# Patient Record
Sex: Male | Born: 1940 | Race: Black or African American | Hispanic: No | Marital: Married | Smoking: Former smoker
Health system: Southern US, Community
[De-identification: ages and names within clinical notes are randomized; demographics above are authoritative.]

## PROBLEM LIST (undated history)

## (undated) DIAGNOSIS — E7800 Pure hypercholesterolemia, unspecified: Secondary | ICD-10-CM

## (undated) DIAGNOSIS — I499 Cardiac arrhythmia, unspecified: Secondary | ICD-10-CM

## (undated) DIAGNOSIS — Z972 Presence of dental prosthetic device (complete) (partial): Secondary | ICD-10-CM

## (undated) DIAGNOSIS — R011 Cardiac murmur, unspecified: Secondary | ICD-10-CM

## (undated) DIAGNOSIS — I639 Cerebral infarction, unspecified: Secondary | ICD-10-CM

## (undated) DIAGNOSIS — E119 Type 2 diabetes mellitus without complications: Secondary | ICD-10-CM

## (undated) DIAGNOSIS — I10 Essential (primary) hypertension: Secondary | ICD-10-CM

## (undated) DIAGNOSIS — N289 Disorder of kidney and ureter, unspecified: Secondary | ICD-10-CM

## (undated) HISTORY — PX: COLONOSCOPY: SHX174

## (undated) HISTORY — DX: Type 2 diabetes mellitus without complications: E11.9

## (undated) HISTORY — DX: Essential (primary) hypertension: I10

---

## 2008-03-19 ENCOUNTER — Ambulatory Visit: Payer: Self-pay | Admitting: Ophthalmology

## 2008-03-22 HISTORY — PX: CATARACT EXTRACTION: SUR2

## 2008-04-03 ENCOUNTER — Ambulatory Visit: Payer: Self-pay | Admitting: Ophthalmology

## 2011-04-21 ENCOUNTER — Ambulatory Visit: Payer: Self-pay

## 2011-11-18 ENCOUNTER — Ambulatory Visit: Payer: Self-pay | Admitting: Internal Medicine

## 2011-11-21 ENCOUNTER — Ambulatory Visit: Payer: Self-pay | Admitting: Internal Medicine

## 2011-11-26 LAB — PROT IMMUNOELECT,UR-24HR

## 2011-12-21 ENCOUNTER — Ambulatory Visit: Payer: Self-pay | Admitting: Internal Medicine

## 2012-01-21 ENCOUNTER — Ambulatory Visit: Payer: Self-pay | Admitting: Internal Medicine

## 2013-05-18 LAB — CBC WITH DIFFERENTIAL/PLATELET
BASOS ABS: 0 10*3/uL (ref 0.0–0.1)
Basophil %: 0.4 %
EOS PCT: 0 %
Eosinophil #: 0 10*3/uL (ref 0.0–0.7)
HCT: 42 % (ref 40.0–52.0)
HGB: 13.4 g/dL (ref 13.0–18.0)
Lymphocyte #: 1.1 10*3/uL (ref 1.0–3.6)
Lymphocyte %: 10.4 %
MCH: 25.6 pg — ABNORMAL LOW (ref 26.0–34.0)
MCHC: 31.9 g/dL — ABNORMAL LOW (ref 32.0–36.0)
MCV: 80 fL (ref 80–100)
MONO ABS: 0.9 x10 3/mm (ref 0.2–1.0)
MONOS PCT: 8.7 %
Neutrophil #: 8.6 10*3/uL — ABNORMAL HIGH (ref 1.4–6.5)
Neutrophil %: 80.5 %
Platelet: 156 10*3/uL (ref 150–440)
RBC: 5.22 10*6/uL (ref 4.40–5.90)
RDW: 15.8 % — ABNORMAL HIGH (ref 11.5–14.5)
WBC: 10.7 10*3/uL — ABNORMAL HIGH (ref 3.8–10.6)

## 2013-05-18 LAB — BASIC METABOLIC PANEL
Anion Gap: 10 (ref 7–16)
BUN: 18 mg/dL (ref 7–18)
CALCIUM: 8.8 mg/dL (ref 8.5–10.1)
Chloride: 103 mmol/L (ref 98–107)
Co2: 24 mmol/L (ref 21–32)
Creatinine: 1.47 mg/dL — ABNORMAL HIGH (ref 0.60–1.30)
EGFR (African American): 54 — ABNORMAL LOW
GFR CALC NON AF AMER: 47 — AB
Glucose: 69 mg/dL (ref 65–99)
OSMOLALITY: 274 (ref 275–301)
POTASSIUM: 3.5 mmol/L (ref 3.5–5.1)
SODIUM: 137 mmol/L (ref 136–145)

## 2013-05-18 LAB — TROPONIN I: TROPONIN-I: 0.15 ng/mL — AB

## 2013-05-18 LAB — RAPID INFLUENZA A&B ANTIGENS

## 2013-05-19 ENCOUNTER — Inpatient Hospital Stay: Payer: Self-pay | Admitting: Internal Medicine

## 2013-05-19 LAB — TROPONIN I
TROPONIN-I: 0.23 ng/mL — AB
Troponin-I: 0.2 ng/mL — ABNORMAL HIGH

## 2013-05-19 LAB — URINALYSIS, COMPLETE
Bilirubin,UR: NEGATIVE
Glucose,UR: NEGATIVE mg/dL (ref 0–75)
Granular Cast: 6
Hyaline Cast: 4
Ketone: NEGATIVE
LEUKOCYTE ESTERASE: NEGATIVE
NITRITE: NEGATIVE
Ph: 5 (ref 4.5–8.0)
RBC,UR: 1 /HPF (ref 0–5)
SPECIFIC GRAVITY: 1.013 (ref 1.003–1.030)
Squamous Epithelial: 1

## 2013-05-19 LAB — PRO B NATRIURETIC PEPTIDE: B-TYPE NATIURETIC PEPTID: 141 pg/mL — AB (ref 0–125)

## 2013-05-19 LAB — CK-MB: CK-MB: 5.7 ng/mL — AB (ref 0.5–3.6)

## 2013-05-19 LAB — CK: CK, TOTAL: 3055 U/L — AB

## 2013-05-20 LAB — BASIC METABOLIC PANEL
Anion Gap: 7 (ref 7–16)
BUN: 20 mg/dL — ABNORMAL HIGH (ref 7–18)
CALCIUM: 8.7 mg/dL (ref 8.5–10.1)
CO2: 25 mmol/L (ref 21–32)
Chloride: 109 mmol/L — ABNORMAL HIGH (ref 98–107)
Creatinine: 1.13 mg/dL (ref 0.60–1.30)
EGFR (African American): >60
EGFR (Non-African Amer.): >60
Glucose: 99 mg/dL (ref 65–99)
Osmolality: 284 (ref 275–301)
POTASSIUM: 3.5 mmol/L (ref 3.5–5.1)
Sodium: 141 mmol/L (ref 136–145)

## 2013-05-20 LAB — MAGNESIUM: MAGNESIUM: 1.7 mg/dL — AB

## 2013-05-20 LAB — CK: CK, TOTAL: 4139 U/L — AB

## 2013-05-21 LAB — CK: CK, Total: 2625 U/L — ABNORMAL HIGH

## 2013-12-20 DIAGNOSIS — E1129 Type 2 diabetes mellitus with other diabetic kidney complication: Secondary | ICD-10-CM | POA: Insufficient documentation

## 2013-12-20 DIAGNOSIS — Z794 Long term (current) use of insulin: Secondary | ICD-10-CM | POA: Insufficient documentation

## 2013-12-20 DIAGNOSIS — R809 Proteinuria, unspecified: Secondary | ICD-10-CM | POA: Insufficient documentation

## 2014-04-03 DIAGNOSIS — E785 Hyperlipidemia, unspecified: Secondary | ICD-10-CM | POA: Diagnosis not present

## 2014-04-03 DIAGNOSIS — E119 Type 2 diabetes mellitus without complications: Secondary | ICD-10-CM | POA: Diagnosis not present

## 2014-04-03 DIAGNOSIS — E1169 Type 2 diabetes mellitus with other specified complication: Secondary | ICD-10-CM | POA: Diagnosis not present

## 2014-04-03 DIAGNOSIS — Z794 Long term (current) use of insulin: Secondary | ICD-10-CM | POA: Diagnosis not present

## 2014-04-03 DIAGNOSIS — I10 Essential (primary) hypertension: Secondary | ICD-10-CM | POA: Diagnosis not present

## 2014-04-09 DIAGNOSIS — E1165 Type 2 diabetes mellitus with hyperglycemia: Secondary | ICD-10-CM | POA: Diagnosis not present

## 2014-04-18 DIAGNOSIS — R809 Proteinuria, unspecified: Secondary | ICD-10-CM | POA: Diagnosis not present

## 2014-04-18 DIAGNOSIS — Z794 Long term (current) use of insulin: Secondary | ICD-10-CM | POA: Diagnosis not present

## 2014-04-18 DIAGNOSIS — E1121 Type 2 diabetes mellitus with diabetic nephropathy: Secondary | ICD-10-CM | POA: Diagnosis not present

## 2014-04-18 DIAGNOSIS — I10 Essential (primary) hypertension: Secondary | ICD-10-CM | POA: Diagnosis not present

## 2014-07-13 NOTE — H&P (Signed)
PATIENT NAME:  Darryl Carter, Darryl Carter MR#:  [id number] DATE OF BIRTH:  19-Jan-1941  DATE OF ADMISSION:  05/19/2013  REFERRING PHYSICIAN:  Dr.  Cheri Guppy.   PRIMARY CARE PHYSICIAN: Dr. Keith Rake.   CHIEF COMPLAINT: Weakness.  HISTORY OF PRESENT ILLNESS:  This is a 74 year old African-American gentleman with past medical history of type 2 diabetes, insulin requiring; hypertension, hyperlipidemia, chronic kidney disease, presenting with weakness, describes two day 2 to 3 days' duration of weakness with associated cough which is nonproductive. His weakness is generalized, most notable when going from sitting to standing position.  He, however, denies any lightheadedness, denies any nausea or vomiting, diarrhea, constipation, chest pains, shortness of breath or myalgias. However, today, secondary to generalized weakness while in the restroom arising from the toilet, he had a possible brief syncopal episode with loss of consciousness less than a few seconds. No post-ictal state. No loss of bowel or bladder function. Upon arrival  to the Emergency Department, he was noted to be hypoxemic, requiring supplemental O2 to maintain SaO2 greater than 92%. Currently without complaints.    REVIEW OF SYSTEMS:  CONSTITUTIONAL: Positive for fatigue, weakness. Denies fevers, chills.  EYES: Denied blurred vision, double vision, eye pain.  EARS, NOSE, THROAT: Denies tinnitus, ear pain, hearing loss.  RESPIRATORY: Positive for cough and wheeze. Denies hemoptysis.  CARDIOVASCULAR: Denies chest pain, palpitations, edema.  GASTROINTESTINAL: Denies nausea, vomiting, diarrhea, abdominal pain.  GENITOURINARY: Denies dysuria, hematuria.  ENDOCRINE: Denies nocturia or thyroid problems.  HEMATOLOGIC/LYMPHATIC: Denies easy bruising or bleeding.  SKIN: Denies rashes or lesions.  MUSCULOSKELETAL: Denies pain in neck, back, shoulder, knees, hips or other arthritic symptoms.  NEUROLOGIC: Denies paralysis, paresthesias.   PSYCHIATRIC: Denies  anxiety or depression.   Otherwise, full review of systems performed by me is negative.   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   SOCIAL HISTORY: Positive for 80 pack-year smoking history; currently, does not smoke.  Denies any alcohol or drug usage.   FAMILY HISTORY: Positive for diabetes, hypertension in multiple family members.   ALLERGIES: No known drug allergies.   HOME MEDICATIONS: Include enalapril 20 mg p.o. b.i.d., metformin 500 mg p.o. b.i.d. Novolin sliding scale as well as Novolin R 16 unit's b.i.d., Lipitor 80 mg p.o. at bedtime, Plavix 75 mg p.o. daily, metoprolol 50 mg p.o. b.i.d., Norvasc 10 mg p.o. daily, hydrochlorothiazide 25 mg p.o. daily, vitamin C 500 mg p.o. daily.   PHYSICAL EXAMINATION: VITAL SIGNS: Temperature 99.5, heart rate 98, respirations 24, blood pressure 156/78, saturating 94% on supplemental O2. Weight 89.4 kg, BMI 30.9.  GENERAL: Well-nourished, well-developed African-American gentleman, currently in no acute distress.  HEAD: Normocephalic, atraumatic.  EYES: Pupils equal, round, and react to light. Extraocular muscles are intact.  No scleral icterus. MOUTH: Moist mucosal membrane. Dentition intact. No abscess noted.  EAR, NOSE, THROAT: Clear, without exudates. No external lesions.  NECK: Supple. No thyromegaly. No nodules. No JVD.  PULMONARY: Diffuse wheezing with prolonged expiratory phase in all lung fields. Offered no use of accessory muscles. Good respiratory effort.  CHEST: Nontender to palpation.  CARDIOVASCULAR: S1, S2, tachycardic. No murmurs, rubs, or gallops. No edema. Pedal pulses 2+ bilaterally.  CHEST: Nontender to palpation.  ABDOMEN: Denies any masses, positive bowel sounds. No hepatosplenomegaly.  MUSCULOSKELETAL: No swelling, clubbing, edema. Range of motion full in all extremities.   NEUROLOGIC: Cranial nerves II through XII intact.  No gross focal neurological deficits. Sensation intact, reflexes intact.  SKIN:  No ulcerations, lesions, rashes, cyanosis. Skin warm, dry. Turgor intact.  PSYCHIATRIC: Mood and affect within normal limits. The patient is alert and oriented x 3. Insight and judgment intact.   LABORATORY DATA: EKG performed;  sinus tachycardia, heart rate 104 with minimal voltage criteria for LVH. No ST or T-wave abnormalities. Chest x-ray performed revealing no acute cardiopulmonary process.   REMAINDER OF LABORATORY DATA: Sodium 137, potassium 3.5, chloride 103, bicarb 24, BUN 18, creatinine 1.47, glucose 169. Troponin 0.15. WBC 10.7, hemoglobin 13.4, platelets 156. Influenza swab positive for influenza A.   ASSESSMENT AND PLAN: A 74 year old African-American gentleman presenting with weakness, as well as brief syncopal episode.  1. Systemic inflammatory response syndrome: Meeting systemic inflammatory response syndrome criteria by heart rate and respiratory rate. He is influenza-positive, started on Tamiflu by the Emergency Department. I will provide intravenous fluid hydration. No indication for antibiotics at this time.  2.  Chronic obstructive pulmonary disease exacerbation in setting of influenza:  DuoNeb therapy q.4 hours, incentive spirometry, Solu-Medrol 60 mg intravenous daily, supplemental O2 to keep oxygen saturation greater than 92%.  3.  Troponin I elevation, likely related to renal function: We will trend cardiac enzymes, continue on telemetry. He has received aspirin.  4.  Possible syncope. Continue to monitor on telemetry. Correct electrolytes as required.  No correction is required at this time.  5. Hypertension: Continue home medications of Norvasc, enalapril, hydrochlorothiazide, metoprolol.  6.  Diabetes. Add insulin sliding scale  with q.6 hour Accu-Cheks. Hold p.o. agents.  7.  Deep venous thrombosis prophylaxis: With heparin subcutaneous.  8.  CODE STATUS: The patient is full code.   TIME  SPENT: 45 minutes.     ____________________________ Aaron Mose. Hower, MD dkh:NTS D: 05/19/2013 02:03:00 ET T: 05/19/2013 02:28:24 ET JOB#: [id number]  cc: Aaron Mose. Hower, MD, <Dictator>  DAVID Woodfin Ganja MD ELECTRONICALLY SIGNED 05/19/2013 20:11

## 2014-07-13 NOTE — Discharge Summary (Signed)
PATIENT NAME:  Darryl Carter, Darryl Carter MR#:  [id number] DATE OF BIRTH:  05-Oct-1940  DATE OF ADMISSION:  05/19/2013 DATE OF DISCHARGE:  05/21/2013  PRIMARY CARE PHYSICIAN:  Dr. Keith Rake.  DISCHARGE DIAGNOSES: 1.  Systemic inflammatory response syndrome,  2.  Influenza.  3. Chronic obstructive pulmonary disease exacerbation. 4.  Rhabdomyolysis.  5.  Chronic kidney disease. 6.  Hypertension.  7.  Diabetes.   CONDITION: Stable.   CODE STATUS: FULL CODE.   MEDICATIONS: Please refer to the medication reconciliation list.   DIET: Low sodium, ADA diet.   ACTIVITY: As tolerated.   FOLLOW-UP CARE: Follow up with PCP within 1 to 2 weeks   REASON FOR ADMISSION: Weakness.   HOSPITAL COURSE: The patient is a 74 year old African American gentleman with a history of hypertension, diabetes, CKD. He presented to the ED with weakness for 2 to 3 days associated with cough, which was nonproductive. The patient had a brief syncope episode with loss of consciousness, less than a few seconds. For detailed history and physical examination, please refer to the admission note dictated by Dr. Lavetta Nielsen.  Laboratory data on admission date showed BUN 18, creatinine 1.47. Troponin 0.15. WBC 10.7, hemoglobin 13.4. Influenza A is positive.  1.  Systemic inflammatory response syndrome, likely due to influenza A. After admission, the patient has been treated with Tamiflu and IV fluid rehydration. The patient's symptoms have much improved.  2.  Chronic obstructive pulmonary disease exacerbation in the setting of influenza.  The patient has been treated with DuoNeb, Solu-Medrol. The patient's symptoms have much improved, off oxygen.  3.  Elevated troponin, which is likely due to renal function. The patient's troponin level is stable. The patient has no chest pain.  4.  Hypertension. Has been treated with Norvasc, enalapril, and Lopressor. Blood pressure has been controlled.  5.  Chronic kidney disease.  After IV fluid support, the  patient's creatinine decreased to normal range.  6.  Rhabdomyolysis. The patient's CK was high, about 4000 after IV fluid rehydration. CK decreased to 2600.  7.  The patient's symptoms have much improved. He has no complaints. Vital signs are stable. He is going to be discharged home today. I discussed the patient's discharge plan with the patient, nurse, and case manager.   TIME SPENT: About 38 minutes.   ____________________________ Demetrios Loll, MD qc:dp D: 05/21/2013 17:14:34 ET T: 05/22/2013 11:00:38 ET JOB#: [id number]  cc: Demetrios Loll, MD, <Dictator> Demetrios Loll MD ELECTRONICALLY SIGNED 05/22/2013 18:12

## 2014-07-17 DIAGNOSIS — Z9181 History of falling: Secondary | ICD-10-CM | POA: Diagnosis not present

## 2014-07-17 DIAGNOSIS — Z1389 Encounter for screening for other disorder: Secondary | ICD-10-CM | POA: Diagnosis not present

## 2014-07-17 DIAGNOSIS — Z0000 Encounter for general adult medical examination without abnormal findings: Secondary | ICD-10-CM | POA: Diagnosis not present

## 2014-07-18 DIAGNOSIS — E1121 Type 2 diabetes mellitus with diabetic nephropathy: Secondary | ICD-10-CM | POA: Diagnosis not present

## 2014-07-25 DIAGNOSIS — I10 Essential (primary) hypertension: Secondary | ICD-10-CM | POA: Diagnosis not present

## 2014-07-25 DIAGNOSIS — R809 Proteinuria, unspecified: Secondary | ICD-10-CM | POA: Diagnosis not present

## 2014-07-25 DIAGNOSIS — Z0000 Encounter for general adult medical examination without abnormal findings: Secondary | ICD-10-CM | POA: Diagnosis not present

## 2014-07-25 DIAGNOSIS — E1121 Type 2 diabetes mellitus with diabetic nephropathy: Secondary | ICD-10-CM | POA: Diagnosis not present

## 2014-07-25 DIAGNOSIS — Z794 Long term (current) use of insulin: Secondary | ICD-10-CM | POA: Diagnosis not present

## 2014-07-25 DIAGNOSIS — E1169 Type 2 diabetes mellitus with other specified complication: Secondary | ICD-10-CM | POA: Diagnosis not present

## 2014-08-30 ENCOUNTER — Other Ambulatory Visit: Payer: Self-pay

## 2014-08-30 DIAGNOSIS — E785 Hyperlipidemia, unspecified: Secondary | ICD-10-CM

## 2014-08-30 MED ORDER — ATORVASTATIN CALCIUM 80 MG PO TABS: 80.0000 mg | ORAL_TABLET | Freq: Every day | ORAL | Status: DC

## 2014-08-30 NOTE — Telephone Encounter (Signed)
Requested from [facility] by fax.

## 2014-10-01 ENCOUNTER — Telehealth: Payer: Self-pay | Admitting: Family Medicine

## 2014-10-01 NOTE — Telephone Encounter (Signed)
Pt has appointment for October 10, 2014 but he is completely out of clopidogrel 75mg . Please send to [email or web address]

## 2014-10-03 MED ORDER — CLOPIDOGREL BISULFATE 75 MG PO TABS: 75.0000 mg | ORAL_TABLET | Freq: Every day | ORAL | Status: DC

## 2014-10-03 NOTE — Telephone Encounter (Signed)
Medication has been refilled and sent to [facility], patient has appointment on 10/10/2014

## 2014-10-10 ENCOUNTER — Ambulatory Visit (INDEPENDENT_AMBULATORY_CARE_PROVIDER_SITE_OTHER): Payer: Medicare Other | Admitting: Family Medicine

## 2014-10-10 ENCOUNTER — Encounter: Payer: Self-pay | Admitting: Family Medicine

## 2014-10-10 VITALS — BP 116/70 | HR 75 | Temp 97.8°F | Resp 19 | Ht 65.0 in | Wt 190.6 lb

## 2014-10-10 DIAGNOSIS — E119 Type 2 diabetes mellitus without complications: Secondary | ICD-10-CM | POA: Diagnosis not present

## 2014-10-10 DIAGNOSIS — E785 Hyperlipidemia, unspecified: Secondary | ICD-10-CM

## 2014-10-10 DIAGNOSIS — I10 Essential (primary) hypertension: Secondary | ICD-10-CM

## 2014-10-10 DIAGNOSIS — Z794 Long term (current) use of insulin: Secondary | ICD-10-CM

## 2014-10-10 DIAGNOSIS — Z8673 Personal history of transient ischemic attack (TIA), and cerebral infarction without residual deficits: Secondary | ICD-10-CM | POA: Diagnosis not present

## 2014-10-10 DIAGNOSIS — N289 Disorder of kidney and ureter, unspecified: Secondary | ICD-10-CM | POA: Insufficient documentation

## 2014-10-10 DIAGNOSIS — E7800 Pure hypercholesterolemia, unspecified: Secondary | ICD-10-CM | POA: Insufficient documentation

## 2014-10-10 MED ORDER — ATORVASTATIN CALCIUM 80 MG PO TABS: 80.0000 mg | ORAL_TABLET | Freq: Every day | ORAL | Status: DC

## 2014-10-10 MED ORDER — ENALAPRIL MALEATE 20 MG PO TABS: 20.0000 mg | ORAL_TABLET | Freq: Two times a day (BID) | ORAL | Status: DC

## 2014-10-10 MED ORDER — METOPROLOL TARTRATE 50 MG PO TABS: 50.0000 mg | ORAL_TABLET | Freq: Two times a day (BID) | ORAL | Status: DC

## 2014-10-10 MED ORDER — CLOPIDOGREL BISULFATE 75 MG PO TABS: 75.0000 mg | ORAL_TABLET | Freq: Every day | ORAL | Status: AC

## 2014-10-10 MED ORDER — AMLODIPINE BESYLATE 10 MG PO TABS: 10.0000 mg | ORAL_TABLET | Freq: Every day | ORAL | Status: DC

## 2014-10-10 MED ORDER — HYDROCHLOROTHIAZIDE 25 MG PO TABS: 25.0000 mg | ORAL_TABLET | Freq: Every day | ORAL | Status: DC

## 2014-10-10 NOTE — Progress Notes (Signed)
Name: Darryl Carter   MRN: [id number]    DOB: 10-07-1940   Date:10/10/2014       Progress Note  Subjective  Chief Complaint  Chief Complaint  Patient presents with  . Medication Refill  . Diabetes    Diabetes He has type 2 diabetes mellitus. His disease course has been stable. There are no hypoglycemic associated symptoms. Pertinent negatives for hypoglycemia include no headaches. Associated symptoms include polydipsia. Pertinent negatives for diabetes include no chest pain, no fatigue, no foot paresthesias and no polyuria. Diabetic complications include a CVA. Current diabetic treatment includes intensive insulin program and oral agent (monotherapy). His weight is stable. He is following a diabetic and generally healthy diet. He rarely participates in exercise. His breakfast blood glucose is taken between 6-7 am. His breakfast blood glucose range is generally 90-110 mg/dl. An ACE inhibitor/angiotensin II receptor blocker is being taken. Eye exam is not current.  Hypertension This is a chronic problem. The problem is unchanged. The problem is controlled. Pertinent negatives include no chest pain, headaches, palpitations or shortness of breath. Past treatments include ACE inhibitors, calcium channel blockers and diuretics. There are no compliance problems.  Hypertensive end-organ damage includes CVA. There is no history of angina, kidney disease or CAD/MI.  Hyperlipidemia This is a chronic problem. Recent lipid tests were reviewed and are normal. Exacerbating diseases include diabetes and obesity. Pertinent negatives include no chest pain or shortness of breath. Current antihyperlipidemic treatment includes statins. The current treatment provides moderate improvement of lipids. There are no compliance problems.  Risk factors for coronary artery disease include diabetes mellitus, dyslipidemia, male sex and obesity.     Past Medical History  Diagnosis Date  . Diabetes mellitus without complication    . Hypertension     Past Surgical History  Procedure Laterality Date  . Cataract extraction Left 2010    Family History  Problem Relation Age of Onset  . Hypertension Mother   . Diabetes Mother   . Hypertension Father   . Diabetes Father   . Hypertension Sister   . Diabetes Sister   . Hypertension Brother   . Diabetes Brother     History   Social History  . Marital Status: Married    Spouse Name: N/A  . Number of Children: N/A  . Years of Education: N/A   Occupational History  . Not on file.   Social History Main Topics  . Smoking status: Former Smoker    Types: Cigarettes    Quit date: 04/24/2001  . Smokeless tobacco: Never Used  . Alcohol Use: No     Comment: Quit in 2003  . Drug Use: No  . Sexual Activity: Not on file   Other Topics Concern  . Not on file   Social History Narrative  . No narrative on file     Current outpatient prescriptions:  .  amLODipine (NORVASC) 10 MG tablet, Take 1 tablet by mouth at bedtime., Disp: , Rfl:  .  atorvastatin (LIPITOR) 80 MG tablet, Take 1 tablet (80 mg total) by mouth daily at 6 PM., Disp: 90 tablet, Rfl: 0 .  clopidogrel (PLAVIX) 75 MG tablet, Take 1 tablet (75 mg total) by mouth daily., Disp: 90 tablet, Rfl: 3 .  enalapril (VASOTEC) 20 MG tablet, Take 1 tablet by mouth 2 (two) times daily., Disp: , Rfl:  .  hydrochlorothiazide (HYDRODIURIL) 25 MG tablet, Take 1 tablet by mouth daily., Disp: , Rfl:  .  insulin NPH Human (NOVOLIN N) 100  UNIT/ML injection, 30 units at bedtime, Disp: , Rfl:  .  insulin regular (NOVOLIN R,HUMULIN R) 100 units/mL injection, Inject 16 Units into the skin 2 (two) times daily before a meal. , Disp: , Rfl:  .  Insulin Syringe-Needle U-100 31G X 15/64" 1 ML MISC, , Disp: , Rfl:  .  metFORMIN (GLUCOPHAGE) 1000 MG tablet, Take 1 tablet by mouth 2 (two) times daily., Disp: , Rfl:  .  metoprolol (LOPRESSOR) 50 MG tablet, Take 1 tablet by mouth 2 (two) times daily., Disp: , Rfl:  .  vitamin C  (ASCORBIC ACID) 500 MG tablet, Take 1 tablet by mouth daily., Disp: , Rfl:   No Known Allergies   Review of Systems  Constitutional: Negative for fatigue.  Respiratory: Negative for shortness of breath.   Cardiovascular: Negative for chest pain and palpitations.  Neurological: Negative for headaches.  Endo/Heme/Allergies: Positive for polydipsia.     Objective  Filed Vitals:   10/10/14 0905  BP: 116/70  Pulse: 75  Temp: 97.8 F (36.6 C)  TempSrc: Oral  Resp: 19  Height: 5\' 5"  (1.651 m)  Weight: 190 lb 9.6 oz (86.456 kg)  SpO2: 94%    Physical Exam  Constitutional: He is oriented to person, place, and time and well-developed, well-nourished, and in no distress.  HENT:  Head: Normocephalic and atraumatic.  Cardiovascular: Normal rate and regular rhythm.   Pulmonary/Chest: Effort normal and breath sounds normal.  Abdominal: Soft. Bowel sounds are normal.  Neurological: He is alert and oriented to person, place, and time.  Skin: Skin is warm and dry.  Psychiatric: Affect normal.  Nursing note and vitals reviewed.    Assessment & Plan  1. Essential hypertension Blood pressure is at goal. Continue present management - amLODipine (NORVASC) 10 MG tablet; Take 1 tablet (10 mg total) by mouth daily.  Dispense: 90 tablet; Refill: 1 - enalapril (VASOTEC) 20 MG tablet; Take 1 tablet (20 mg total) by mouth 2 (two) times daily.  Dispense: 180 tablet; Refill: 1 - hydrochlorothiazide (HYDRODIURIL) 25 MG tablet; Take 1 tablet (25 mg total) by mouth daily.  Dispense: 90 tablet; Refill: 1 - metoprolol (LOPRESSOR) 50 MG tablet; Take 1 tablet (50 mg total) by mouth 2 (two) times daily.  Dispense: 180 tablet; Refill: 1  2. Type 2 diabetes mellitus treated with insulin Patient is being followed and managed by endocrinology. Continue before ACE-I for renal protection - enalapril (VASOTEC) 20 MG tablet; Take 1 tablet (20 mg total) by mouth 2 (two) times daily.  Dispense: 180 tablet;  Refill: 1  3. Cerebrovascular accident, old  - clopidogrel (PLAVIX) 75 MG tablet; Take 1 tablet (75 mg total) by mouth daily.  Dispense: 90 tablet; Refill: 3  4. HLD (hyperlipidemia) HDL was below the normal range at 32 in May 2016. Recheck FLP today - atorvastatin (LIPITOR) 80 MG tablet; Take 1 tablet (80 mg total) by mouth daily at 6 PM.  Dispense: 90 tablet; Refill: 1 - Lipid panel - Comprehensive Metabolic Panel (CMET)   Saidi Santacroce Asad A[facility] 10/10/2014 9:43 AM

## 2014-10-11 LAB — LIPID PANEL
CHOLESTEROL TOTAL: 116 mg/dL (ref 100–199)
Chol/HDL Ratio: 3.7 ratio units (ref 0.0–5.0)
HDL: 31 mg/dL — AB (ref >39)
LDL Calculated: 67 mg/dL (ref 0–99)
Triglycerides: 89 mg/dL (ref 0–149)
VLDL Cholesterol Cal: 18 mg/dL (ref 5–40)

## 2014-10-11 LAB — COMPREHENSIVE METABOLIC PANEL
A/G RATIO: 1.6 (ref 1.1–2.5)
ALT: 19 IU/L (ref 0–44)
AST: 20 IU/L (ref 0–40)
Albumin: 4.2 g/dL (ref 3.5–4.8)
Alkaline Phosphatase: 97 IU/L (ref 39–117)
BUN/Creatinine Ratio: 10 (ref 10–22)
BUN: 11 mg/dL (ref 8–27)
Bilirubin Total: 0.3 mg/dL (ref 0.0–1.2)
CO2: 25 mmol/L (ref 18–29)
Calcium: 9.4 mg/dL (ref 8.6–10.2)
Chloride: 103 mmol/L (ref 97–108)
Creatinine, Ser: 1.05 mg/dL (ref 0.76–1.27)
GFR calc Af Amer: 81 mL/min/{1.73_m2} (ref >59)
GFR calc non Af Amer: 70 mL/min/{1.73_m2} (ref >59)
Globulin, Total: 2.6 g/dL (ref 1.5–4.5)
Glucose: 114 mg/dL — ABNORMAL HIGH (ref 65–99)
Potassium: 4.7 mmol/L (ref 3.5–5.2)
Sodium: 144 mmol/L (ref 134–144)
Total Protein: 6.8 g/dL (ref 6.0–8.5)

## 2015-04-16 ENCOUNTER — Other Ambulatory Visit: Payer: Self-pay | Admitting: Family Medicine

## 2015-04-16 NOTE — Telephone Encounter (Signed)
Medication refill request has been refused due to patient needs to schedule a Medication refill appointment he has not been seen since 10/10/2014

## 2015-04-18 ENCOUNTER — Ambulatory Visit (INDEPENDENT_AMBULATORY_CARE_PROVIDER_SITE_OTHER): Payer: Medicare Other | Admitting: Family Medicine

## 2015-04-18 ENCOUNTER — Encounter: Payer: Self-pay | Admitting: Family Medicine

## 2015-04-18 VITALS — BP 118/70 | HR 64 | Temp 98.0°F | Resp 18 | Ht 65.0 in | Wt 186.9 lb

## 2015-04-18 DIAGNOSIS — E785 Hyperlipidemia, unspecified: Secondary | ICD-10-CM | POA: Diagnosis not present

## 2015-04-18 DIAGNOSIS — I10 Essential (primary) hypertension: Secondary | ICD-10-CM

## 2015-04-18 MED ORDER — ATORVASTATIN CALCIUM 80 MG PO TABS: 80.0000 mg | ORAL_TABLET | Freq: Every day | ORAL | Status: DC

## 2015-04-18 MED ORDER — ENALAPRIL MALEATE 20 MG PO TABS: 20.0000 mg | ORAL_TABLET | Freq: Two times a day (BID) | ORAL | Status: AC

## 2015-04-18 MED ORDER — AMLODIPINE BESYLATE 10 MG PO TABS: 10.0000 mg | ORAL_TABLET | Freq: Every day | ORAL | Status: AC

## 2015-04-18 MED ORDER — METOPROLOL TARTRATE 50 MG PO TABS: 50.0000 mg | ORAL_TABLET | Freq: Two times a day (BID) | ORAL | Status: AC

## 2015-04-18 MED ORDER — HYDROCHLOROTHIAZIDE 25 MG PO TABS: 25.0000 mg | ORAL_TABLET | Freq: Every day | ORAL | Status: AC

## 2015-04-18 NOTE — Progress Notes (Signed)
Name: Darryl Carter   MRN: [id number]    DOB: 07/21/1940   Date:04/18/2015       Progress Note  Subjective  Chief Complaint  Chief Complaint  Patient presents with  . Medication Refill    hydrochlorothiazide 25 mg   . Diabetes  . Hypertension  . Hyperlipidemia    Hypertension This is a chronic problem. The problem is controlled. Pertinent negatives include no blurred vision, chest pain, headaches, palpitations or shortness of breath. Past treatments include beta blockers, calcium channel blockers, diuretics and ACE inhibitors.  Hyperlipidemia This is a chronic problem. The problem is controlled. Recent lipid tests were reviewed and are normal. Pertinent negatives include no chest pain, leg pain, myalgias or shortness of breath. Current antihyperlipidemic treatment includes statins.     Past Medical History  Diagnosis Date  . Diabetes mellitus without complication ([place])   . Hypertension     Past Surgical History  Procedure Laterality Date  . Cataract extraction Left 2010    Family History  Problem Relation Age of Onset  . Hypertension Mother   . Diabetes Mother   . Hypertension Father   . Diabetes Father   . Hypertension Sister   . Diabetes Sister   . Hypertension Brother   . Diabetes Brother     Social History   Social History  . Marital Status: Married    Spouse Name: N/A  . Number of Children: N/A  . Years of Education: N/A   Occupational History  . Not on file.   Social History Main Topics  . Smoking status: Former Smoker    Types: Cigarettes    Quit date: 04/24/2001  . Smokeless tobacco: Never Used  . Alcohol Use: No     Comment: Quit in 2003  . Drug Use: No  . Sexual Activity: Not on file   Other Topics Concern  . Not on file   Social History Narrative     Current outpatient prescriptions:  .  amLODipine (NORVASC) 10 MG tablet, Take 1 tablet (10 mg total) by mouth daily., Disp: 90 tablet, Rfl: 1 .  atorvastatin (LIPITOR) 80 MG tablet, Take 1  tablet (80 mg total) by mouth daily at 6 PM., Disp: 90 tablet, Rfl: 1 .  clopidogrel (PLAVIX) 75 MG tablet, Take 1 tablet (75 mg total) by mouth daily., Disp: 90 tablet, Rfl: 3 .  enalapril (VASOTEC) 20 MG tablet, Take 1 tablet (20 mg total) by mouth 2 (two) times daily., Disp: 180 tablet, Rfl: 1 .  hydrochlorothiazide (HYDRODIURIL) 25 MG tablet, Take 1 tablet (25 mg total) by mouth daily., Disp: 90 tablet, Rfl: 1 .  insulin NPH Human (NOVOLIN N) 100 UNIT/ML injection, 30 units at bedtime, Disp: , Rfl:  .  insulin regular (NOVOLIN R,HUMULIN R) 100 units/mL injection, Inject 16 Units into the skin 2 (two) times daily before a meal. , Disp: , Rfl:  .  Insulin Syringe-Needle U-100 31G X 15/64" 1 ML MISC, , Disp: , Rfl:  .  metFORMIN (GLUCOPHAGE) 1000 MG tablet, Take 1 tablet by mouth 2 (two) times daily., Disp: , Rfl:  .  metoprolol (LOPRESSOR) 50 MG tablet, Take 1 tablet (50 mg total) by mouth 2 (two) times daily., Disp: 180 tablet, Rfl: 1 .  vitamin C (ASCORBIC ACID) 500 MG tablet, Take 1 tablet by mouth daily., Disp: , Rfl:   No Known Allergies   Review of Systems  Constitutional: Negative for fever and chills.  Eyes: Negative for blurred vision.  Respiratory:  Negative for shortness of breath.   Cardiovascular: Negative for chest pain and palpitations.  Musculoskeletal: Negative for myalgias.  Neurological: Negative for headaches.    Objective  Filed Vitals:   04/18/15 0906  BP: 118/70  Pulse: 64  Temp: 98 F (36.7 C)  TempSrc: Oral  Resp: 18  Height: 5\' 5"  (1.651 m)  Weight: 186 lb 14.4 oz (84.777 kg)  SpO2: 95%    Physical Exam  Constitutional: He is oriented to person, place, and time and well-developed, well-nourished, and in no distress.  HENT:  Head: Normocephalic and atraumatic.  Cardiovascular: Normal rate and regular rhythm.   Pulmonary/Chest: Effort normal and breath sounds normal.  Abdominal: Soft. Bowel sounds are normal.  Neurological: He is alert and  oriented to person, place, and time.  Nursing note and vitals reviewed.   Assessment & Plan  1. Essential hypertension  BP stable and controlled on present therapy - amLODipine (NORVASC) 10 MG tablet; Take 1 tablet (10 mg total) by mouth daily.  Dispense: 90 tablet; Refill: 1 - hydrochlorothiazide (HYDRODIURIL) 25 MG tablet; Take 1 tablet (25 mg total) by mouth daily.  Dispense: 90 tablet; Refill: 1 - metoprolol (LOPRESSOR) 50 MG tablet; Take 1 tablet (50 mg total) by mouth 2 (two) times daily.  Dispense: 180 tablet; Refill: 1 - enalapril (VASOTEC) 20 MG tablet; Take 1 tablet (20 mg total) by mouth 2 (two) times daily.  Dispense: 180 tablet; Refill: 1  2. HLD (hyperlipidemia)  FLP from July 2016 reviewed. - atorvastatin (LIPITOR) 80 MG tablet; Take 1 tablet (80 mg total) by mouth daily at 6 PM.  Dispense: 90 tablet; Refill: 1    Ura Yingling Asad A. [facility] 04/18/2015 9:16 AM

## 2015-04-20 ENCOUNTER — Ambulatory Visit: Payer: Medicare Other | Admitting: Family Medicine

## 2015-04-29 DIAGNOSIS — E782 Mixed hyperlipidemia: Secondary | ICD-10-CM | POA: Diagnosis not present

## 2015-04-29 DIAGNOSIS — E119 Type 2 diabetes mellitus without complications: Secondary | ICD-10-CM | POA: Diagnosis not present

## 2015-04-29 DIAGNOSIS — I10 Essential (primary) hypertension: Secondary | ICD-10-CM | POA: Diagnosis not present

## 2015-04-29 DIAGNOSIS — Z794 Long term (current) use of insulin: Secondary | ICD-10-CM | POA: Diagnosis not present

## 2015-05-29 DIAGNOSIS — E1129 Type 2 diabetes mellitus with other diabetic kidney complication: Secondary | ICD-10-CM | POA: Diagnosis not present

## 2015-05-29 DIAGNOSIS — Z794 Long term (current) use of insulin: Secondary | ICD-10-CM | POA: Diagnosis not present

## 2015-05-29 DIAGNOSIS — R809 Proteinuria, unspecified: Secondary | ICD-10-CM | POA: Diagnosis not present

## 2015-06-05 DIAGNOSIS — E1129 Type 2 diabetes mellitus with other diabetic kidney complication: Secondary | ICD-10-CM | POA: Diagnosis not present

## 2015-06-05 DIAGNOSIS — E785 Hyperlipidemia, unspecified: Secondary | ICD-10-CM | POA: Diagnosis not present

## 2015-06-05 DIAGNOSIS — Z794 Long term (current) use of insulin: Secondary | ICD-10-CM | POA: Diagnosis not present

## 2015-06-05 DIAGNOSIS — E1165 Type 2 diabetes mellitus with hyperglycemia: Secondary | ICD-10-CM | POA: Diagnosis not present

## 2015-06-05 DIAGNOSIS — I10 Essential (primary) hypertension: Secondary | ICD-10-CM | POA: Diagnosis not present

## 2015-07-14 DIAGNOSIS — E1165 Type 2 diabetes mellitus with hyperglycemia: Secondary | ICD-10-CM | POA: Diagnosis not present

## 2015-07-21 DIAGNOSIS — E785 Hyperlipidemia, unspecified: Secondary | ICD-10-CM | POA: Diagnosis not present

## 2015-07-21 DIAGNOSIS — E1165 Type 2 diabetes mellitus with hyperglycemia: Secondary | ICD-10-CM | POA: Diagnosis not present

## 2015-07-21 DIAGNOSIS — I10 Essential (primary) hypertension: Secondary | ICD-10-CM | POA: Diagnosis not present

## 2015-07-21 DIAGNOSIS — E10649 Type 1 diabetes mellitus with hypoglycemia without coma: Secondary | ICD-10-CM | POA: Diagnosis not present

## 2015-07-21 DIAGNOSIS — E1129 Type 2 diabetes mellitus with other diabetic kidney complication: Secondary | ICD-10-CM | POA: Diagnosis not present

## 2015-07-29 DIAGNOSIS — R809 Proteinuria, unspecified: Secondary | ICD-10-CM | POA: Diagnosis not present

## 2015-07-29 DIAGNOSIS — Z794 Long term (current) use of insulin: Secondary | ICD-10-CM | POA: Diagnosis not present

## 2015-07-29 DIAGNOSIS — E1129 Type 2 diabetes mellitus with other diabetic kidney complication: Secondary | ICD-10-CM | POA: Diagnosis not present

## 2015-07-29 DIAGNOSIS — E1165 Type 2 diabetes mellitus with hyperglycemia: Secondary | ICD-10-CM | POA: Diagnosis not present

## 2015-07-29 DIAGNOSIS — E11649 Type 2 diabetes mellitus with hypoglycemia without coma: Secondary | ICD-10-CM | POA: Diagnosis not present

## 2015-10-01 ENCOUNTER — Other Ambulatory Visit: Payer: Self-pay | Admitting: Family Medicine

## 2015-10-17 DIAGNOSIS — E7800 Pure hypercholesterolemia, unspecified: Secondary | ICD-10-CM | POA: Diagnosis not present

## 2015-10-17 DIAGNOSIS — Z125 Encounter for screening for malignant neoplasm of prostate: Secondary | ICD-10-CM | POA: Diagnosis not present

## 2015-10-17 DIAGNOSIS — L80 Vitiligo: Secondary | ICD-10-CM | POA: Diagnosis not present

## 2015-10-17 DIAGNOSIS — E1165 Type 2 diabetes mellitus with hyperglycemia: Secondary | ICD-10-CM | POA: Diagnosis not present

## 2015-10-17 DIAGNOSIS — E118 Type 2 diabetes mellitus with unspecified complications: Secondary | ICD-10-CM | POA: Diagnosis not present

## 2015-10-17 DIAGNOSIS — I10 Essential (primary) hypertension: Secondary | ICD-10-CM | POA: Diagnosis not present

## 2015-10-17 DIAGNOSIS — Z794 Long term (current) use of insulin: Secondary | ICD-10-CM | POA: Diagnosis not present

## 2015-10-21 ENCOUNTER — Other Ambulatory Visit: Payer: Self-pay | Admitting: Family Medicine

## 2015-10-21 DIAGNOSIS — E11649 Type 2 diabetes mellitus with hypoglycemia without coma: Secondary | ICD-10-CM | POA: Diagnosis not present

## 2015-10-21 DIAGNOSIS — I10 Essential (primary) hypertension: Secondary | ICD-10-CM

## 2015-10-21 DIAGNOSIS — E785 Hyperlipidemia, unspecified: Secondary | ICD-10-CM | POA: Diagnosis not present

## 2015-10-21 DIAGNOSIS — E1165 Type 2 diabetes mellitus with hyperglycemia: Secondary | ICD-10-CM | POA: Diagnosis not present

## 2015-10-21 DIAGNOSIS — E1129 Type 2 diabetes mellitus with other diabetic kidney complication: Secondary | ICD-10-CM | POA: Diagnosis not present

## 2015-10-21 DIAGNOSIS — Z794 Long term (current) use of insulin: Secondary | ICD-10-CM | POA: Diagnosis not present

## 2015-10-28 ENCOUNTER — Other Ambulatory Visit: Payer: Self-pay | Admitting: Family Medicine

## 2015-10-28 DIAGNOSIS — I10 Essential (primary) hypertension: Secondary | ICD-10-CM

## 2015-10-29 ENCOUNTER — Other Ambulatory Visit: Payer: Self-pay | Admitting: Family Medicine

## 2015-10-29 DIAGNOSIS — I10 Essential (primary) hypertension: Secondary | ICD-10-CM

## 2015-11-04 DIAGNOSIS — B360 Pityriasis versicolor: Secondary | ICD-10-CM | POA: Diagnosis not present

## 2015-11-04 DIAGNOSIS — L918 Other hypertrophic disorders of the skin: Secondary | ICD-10-CM | POA: Diagnosis not present

## 2015-12-01 ENCOUNTER — Other Ambulatory Visit: Payer: Self-pay | Admitting: Family Medicine

## 2015-12-01 DIAGNOSIS — E785 Hyperlipidemia, unspecified: Secondary | ICD-10-CM

## 2016-01-05 ENCOUNTER — Telehealth: Payer: Self-pay | Admitting: Family Medicine

## 2016-01-05 NOTE — Telephone Encounter (Signed)
Called Pt to schedule AWV with [facility] 10/30 - knb

## 2016-03-08 ENCOUNTER — Telehealth: Payer: Self-pay | Admitting: Family Medicine

## 2016-03-08 NOTE — Telephone Encounter (Signed)
Called Pt to schedule AWV with [facility] - knb °

## 2016-07-13 NOTE — Discharge Instructions (Signed)
Cataract Surgery, Care After °Refer to this sheet in the next few weeks. These instructions provide you with information about caring for yourself after your procedure. Your health care provider may also give you more specific instructions. Your treatment has been planned according to current medical practices, but problems sometimes occur. Call your health care provider if you have any problems or questions after your procedure. °What can I expect after the procedure? °After the procedure, it is common to have: °· Itching. °· Discomfort. °· Fluid discharge. °· Sensitivity to light and to touch. °· Bruising. °Follow these instructions at home: °Eye Care  °· Check your eye every day for signs of infection. Watch for: °¨ Redness, swelling, or pain. °¨ Fluid, blood, or pus. °¨ Warmth. °¨ Bad smell. °Activity  °· Avoid strenuous activities, such as playing contact sports, for as long as told by your health care provider. °· Do not drive or operate heavy machinery until your health care provider approves. °· Do not bend or lift heavy objects . Bending increases pressure in the eye. You can walk, climb stairs, and do light household chores. °· Ask your health care provider when you can return to work. If you work in a dusty environment, you may be advised to wear protective eyewear for a period of time. °General instructions  °· Take or apply over-the-counter and prescription medicines only as told by your health care provider. This includes eye drops. °· Do not touch or rub your eyes. °· If you were given a protective shield, wear it as told by your health care provider. If you were not given a protective shield, wear sunglasses as told by your health care provider to protect your eyes. °· Keep the area around your eye clean and dry. Avoid swimming or allowing water to hit you directly in the face while showering until told by your health care provider. Keep soap and shampoo out of your eyes. °· Do not put a contact lens  into the affected eye or eyes until your health care provider approves. °· Keep all follow-up visits as told by your health care provider. This is important. °Contact a health care provider if: ° °· You have increased bruising around your eye. °· You have pain that is not helped with medicine. °· You have a fever. °· You have redness, swelling, or pain in your eye. °· You have fluid, blood, or pus coming from your incision. °· Your vision gets worse. °Get help right away if: °· You have sudden vision loss. °This information is not intended to replace advice given to you by your health care provider. Make sure you discuss any questions you have with your health care provider. °Document Released: 09/25/2004 Document Revised: 07/17/2015 Document Reviewed: 01/16/2015 °Elsevier Interactive Patient Education © 2017 [employer] ° ° ° ° °General Anesthesia, Adult, Care After °These instructions provide you with information about caring for yourself after your procedure. Your health care provider may also give you more specific instructions. Your treatment has been planned according to current medical practices, but problems sometimes occur. Call your health care provider if you have any problems or questions after your procedure. °What can I expect after the procedure? °After the procedure, it is common to have: °· Vomiting. °· A sore throat. °· Mental slowness. °It is common to feel: °· Nauseous. °· Cold or shivery. °· Sleepy. °· Tired. °· Sore or achy, even in parts of your body where you did not have surgery. °Follow these instructions at   home: °For at least 24 hours after the procedure:  °· Do not: °¨ Participate in activities where you could fall or become injured. °¨ Drive. °¨ Use heavy machinery. °¨ Drink alcohol. °¨ Take sleeping pills or medicines that cause drowsiness. °¨ Make important decisions or sign legal documents. °¨ Take care of children on your own. °· Rest. °Eating and drinking  °· If you vomit, drink  water, juice, or soup when you can drink without vomiting. °· Drink enough fluid to keep your urine clear or pale yellow. °· Make sure you have little or no nausea before eating solid foods. °· Follow the diet recommended by your health care provider. °General instructions  °· Have a responsible adult stay with you until you are awake and alert. °· Return to your normal activities as told by your health care provider. Ask your health care provider what activities are safe for you. °· Take over-the-counter and prescription medicines only as told by your health care provider. °· If you smoke, do not smoke without supervision. °· Keep all follow-up visits as told by your health care provider. This is important. °Contact a health care provider if: °· You continue to have nausea or vomiting at home, and medicines are not helpful. °· You cannot drink fluids or start eating again. °· You cannot urinate after 8-12 hours. °· You develop a skin rash. °· You have fever. °· You have increasing redness at the site of your procedure. °Get help right away if: °· You have difficulty breathing. °· You have chest pain. °· You have unexpected bleeding. °· You feel that you are having a life-threatening or urgent problem. °This information is not intended to replace advice given to you by your health care provider. Make sure you discuss any questions you have with your health care provider. °Document Released: 06/14/2000 Document Revised: 08/11/2015 Document Reviewed: 02/20/2015 °Elsevier Interactive Patient Education © 2017 Elsevier Inc. ° °

## 2016-07-14 ENCOUNTER — Ambulatory Visit: Payer: Medicare Other | Admitting: Anesthesiology

## 2016-07-14 ENCOUNTER — Encounter
Admission: RE | Disposition: A | Discharge status: Home / Self Care | Payer: Self-pay | Source: Ambulatory Visit | Attending: Ophthalmology

## 2016-07-14 ENCOUNTER — Ambulatory Visit
Admission: RE | Admit: 2016-07-14 | Discharge: 2016-07-14 | Disposition: A | Discharge status: Home / Self Care | Payer: Medicare Other | Source: Ambulatory Visit | Attending: Ophthalmology | Admitting: Ophthalmology

## 2016-07-14 DIAGNOSIS — Z794 Long term (current) use of insulin: Secondary | ICD-10-CM | POA: Insufficient documentation

## 2016-07-14 DIAGNOSIS — Z87891 Personal history of nicotine dependence: Secondary | ICD-10-CM | POA: Diagnosis not present

## 2016-07-14 DIAGNOSIS — I10 Essential (primary) hypertension: Secondary | ICD-10-CM | POA: Diagnosis not present

## 2016-07-14 DIAGNOSIS — Z79899 Other long term (current) drug therapy: Secondary | ICD-10-CM | POA: Insufficient documentation

## 2016-07-14 DIAGNOSIS — E1136 Type 2 diabetes mellitus with diabetic cataract: Secondary | ICD-10-CM | POA: Insufficient documentation

## 2016-07-14 HISTORY — PX: CATARACT EXTRACTION W/PHACO: SHX586

## 2016-07-14 HISTORY — DX: Cardiac murmur, unspecified: R01.1

## 2016-07-14 HISTORY — DX: Pure hypercholesterolemia, unspecified: E78.00

## 2016-07-14 HISTORY — DX: Presence of dental prosthetic device (complete) (partial): Z97.2

## 2016-07-14 HISTORY — DX: Cerebral infarction, unspecified: I63.9

## 2016-07-14 HISTORY — DX: Cardiac arrhythmia, unspecified: I49.9

## 2016-07-14 LAB — GLUCOSE, CAPILLARY
GLUCOSE-CAPILLARY: 115 mg/dL — AB (ref 65–99)
GLUCOSE-CAPILLARY: 119 mg/dL — AB (ref 65–99)

## 2016-07-14 SURGERY — PHACOEMULSIFICATION, CATARACT, WITH IOL INSERTION
Anesthesia: Monitor Anesthesia Care | Site: Eye | Laterality: Right | Wound class: Clean

## 2016-07-14 MED ORDER — MOXIFLOXACIN HCL 0.5 % OP SOLN
1.0000 [drp] | OPHTHALMIC | Status: DC | PRN
  Administered 2016-07-14 (×3): 1 [drp] via OPHTHALMIC

## 2016-07-14 MED ORDER — LIDOCAINE HCL (PF) 2 % IJ SOLN
INTRAOCULAR | Status: DC | PRN
  Administered 2016-07-14: 1 mL via INTRAOCULAR

## 2016-07-14 MED ORDER — ARMC OPHTHALMIC DILATING DROPS
1.0000 "application " | OPHTHALMIC | Status: DC | PRN
  Administered 2016-07-14 (×3): 1 via OPHTHALMIC

## 2016-07-14 MED ORDER — FENTANYL CITRATE (PF) 100 MCG/2ML IJ SOLN
INTRAMUSCULAR | Status: DC | PRN
  Administered 2016-07-14: 50 ug via INTRAVENOUS

## 2016-07-14 MED ORDER — NA HYALUR & NA CHOND-NA HYALUR 0.4-0.35 ML IO KIT
PACK | INTRAOCULAR | Status: DC | PRN
  Administered 2016-07-14: 1 mL via INTRAOCULAR

## 2016-07-14 MED ORDER — EPINEPHRINE PF 1 MG/ML IJ SOLN
INTRAOCULAR | Status: DC | PRN
  Administered 2016-07-14: 56 mL via OPHTHALMIC

## 2016-07-14 MED ORDER — BRIMONIDINE TARTRATE-TIMOLOL 0.2-0.5 % OP SOLN
OPHTHALMIC | Status: DC | PRN
  Administered 2016-07-14: 1 [drp] via OPHTHALMIC

## 2016-07-14 MED ORDER — MIDAZOLAM HCL 2 MG/2ML IJ SOLN
INTRAMUSCULAR | Status: DC | PRN
  Administered 2016-07-14: 1.5 mg via INTRAVENOUS

## 2016-07-14 MED ORDER — LACTATED RINGERS IV SOLN: INTRAVENOUS | Status: DC

## 2016-07-14 MED ORDER — CEFUROXIME OPHTHALMIC INJECTION 1 MG/0.1 ML
INJECTION | OPHTHALMIC | Status: DC | PRN
  Administered 2016-07-14: .3 mL via OPHTHALMIC

## 2016-07-14 SURGICAL SUPPLY — 28 items
CANNULA ANT/CHMB 27GA (MISCELLANEOUS) ×2 IMPLANT
CARTRIDGE ABBOTT (MISCELLANEOUS) IMPLANT
GLOVE SURG LX 7.5 STRW (GLOVE) ×1
GLOVE SURG LX STRL 7.5 STRW (GLOVE) ×1 IMPLANT
GLOVE SURG TRIUMPH 8.0 PF LTX (GLOVE) ×2 IMPLANT
GOWN STRL REUS W/ TWL LRG LVL3 (GOWN DISPOSABLE) ×2 IMPLANT
GOWN STRL REUS W/TWL LRG LVL3 (GOWN DISPOSABLE) ×2
KNIFE CLEARCUT SLIT (MISCELLANEOUS) ×2 IMPLANT
KNIFE SIDECUT EYE (MISCELLANEOUS) ×2 IMPLANT
LENS IOL ACRSF IQ ULTRA 17.5 (Intraocular Lens) ×1 IMPLANT
LENS IOL ACRYSOF IQ 17.5 (Intraocular Lens) ×2 IMPLANT
MARKER SKIN DUAL TIP RULER LAB (MISCELLANEOUS) ×2 IMPLANT
NDL RETROBULBAR .5 NSTRL (NEEDLE) IMPLANT
NEEDLE FILTER BLUNT 18X 1/2SAF (NEEDLE) ×1
NEEDLE FILTER BLUNT 18X1 1/2 (NEEDLE) ×1 IMPLANT
PACK CATARACT BRASINGTON (MISCELLANEOUS) IMPLANT
PACK EYE AFTER SURG (MISCELLANEOUS) ×2 IMPLANT
PACK OPTHALMIC (MISCELLANEOUS) ×2 IMPLANT
RING MALYGIN 7.0 (MISCELLANEOUS) IMPLANT
SUT ETHILON 10-0 CS-B-6CS-B-6 (SUTURE)
SUT VICRYL  9 0 (SUTURE)
SUT VICRYL 9 0 (SUTURE) IMPLANT
SUTURE EHLN 10-0 CS-B-6CS-B-6 (SUTURE) IMPLANT
SYR 3ML LL SCALE MARK (SYRINGE) ×2 IMPLANT
SYR 5ML LL (SYRINGE) ×2 IMPLANT
SYR TB 1ML LUER SLIP (SYRINGE) ×2 IMPLANT
WATER STERILE IRR 250ML POUR (IV SOLUTION) ×2 IMPLANT
WIPE NON LINTING 3.25X3.25 (MISCELLANEOUS) ×2 IMPLANT

## 2016-07-14 NOTE — Anesthesia Procedure Notes (Signed)
Procedure Name: MAC Performed by: Mayme Genta Pre-anesthesia Checklist: Patient identified, Emergency Drugs available, Suction available, Timeout performed and Patient being monitored Patient Re-evaluated:Patient Re-evaluated prior to inductionOxygen Delivery Method: Nasal cannula Placement Confirmation: positive ETCO2

## 2016-07-14 NOTE — H&P (Signed)
The History and Physical notes are on paper, have been signed, and are to be scanned. The patient remains stable and unchanged from the H&P.   Previous H&P reviewed, patient examined, and there are no changes.  Darryl Carter 07/14/2016 8:20 AM

## 2016-07-14 NOTE — Op Note (Signed)
LOCATION:  [facility]   PREOPERATIVE DIAGNOSIS:    Nuclear sclerotic cataract right eye. H25.11   POSTOPERATIVE DIAGNOSIS:  Nuclear sclerotic cataract right eye.     PROCEDURE:  Phacoemusification with posterior chamber intraocular lens placement of the right eye   LENS:   Implant Name Type Inv. Item Serial No. Manufacturer Lot No. LRB No. Used  LENS IOL ACRYSOF IQ 17.5 - [id number] Intraocular Lens LENS IOL ACRYSOF IQ 17.5 [id number] [employer]   Right 1        ULTRASOUND TIME: 15 % of 1 minutes, 6 seconds.  CDE 9.6   SURGEON:  Wyonia Hough, MD   ANESTHESIA:  Topical with tetracaine drops and 2% Xylocaine jelly, augmented with 1% preservative-free intracameral lidocaine.    COMPLICATIONS:  None.   DESCRIPTION OF PROCEDURE:  The patient was identified in the holding room and transported to the operating room and placed in the supine position under the operating microscope.  The right eye was identified as the operative eye and it was prepped and draped in the usual sterile ophthalmic fashion.   A 1 millimeter clear-corneal paracentesis was made at the 12:00 position.  0.5 ml of preservative-free 1% lidocaine was injected into the anterior chamber. The anterior chamber was filled with Viscoat viscoelastic.  A 2.4 millimeter keratome was used to make a near-clear corneal incision at the 9:00 position.  A curvilinear capsulorrhexis was made with a cystotome and capsulorrhexis forceps.  Balanced salt solution was used to hydrodissect and hydrodelineate the nucleus.   Phacoemulsification was then used in stop and chop fashion to remove the lens nucleus and epinucleus.  The remaining cortex was then removed using the irrigation and aspiration handpiece. Provisc was then placed into the capsular bag to distend it for lens placement.  A lens was then injected into the capsular bag.  The remaining viscoelastic was aspirated.   Wounds were hydrated with balanced salt solution.   The anterior chamber was inflated to a physiologic pressure with balanced salt solution.  No wound leaks were noted. Cefuroxime 0.1 ml of a 10mg /ml solution was injected into the anterior chamber for a dose of 1 mg of intracameral antibiotic at the completion of the case.   Timolol and Brimonidine drops were applied to the eye.  The patient was taken to the recovery room in stable condition without complications of anesthesia or surgery.   Chuck Caban 07/14/2016, 9:11 AM

## 2016-07-14 NOTE — Anesthesia Preprocedure Evaluation (Signed)
Anesthesia Evaluation  Patient identified by MRN, date of birth, ID band Patient awake    Reviewed: Allergy & Precautions, H&P , NPO status , Patient's Chart, lab work & pertinent test results  Airway Mallampati: II  TM Distance: >3 FB Neck ROM: full    Dental no notable dental hx.    Pulmonary former smoker,    Pulmonary exam normal        Cardiovascular hypertension, On Medications Normal cardiovascular exam     Neuro/Psych    GI/Hepatic negative GI ROS, Neg liver ROS,   Endo/Other  diabetes, Well Controlled, Type 2, Insulin Dependent  Renal/GU   negative genitourinary   Musculoskeletal   Abdominal   Peds  Hematology negative hematology ROS (+)   Anesthesia Other Findings   Reproductive/Obstetrics negative OB ROS                             Anesthesia Physical Anesthesia Plan  ASA: II  Anesthesia Plan: MAC   Post-op Pain Management:    Induction:   Airway Management Planned:   Additional Equipment:   Intra-op Plan:   Post-operative Plan:   Informed Consent:   Plan Discussed with:   Anesthesia Plan Comments:         Anesthesia Quick Evaluation

## 2016-07-14 NOTE — Transfer of Care (Signed)
Immediate Anesthesia Transfer of Care Note  Patient: Darryl Carter  Procedure(s) Performed: Procedure(s) with comments: CATARACT EXTRACTION PHACO AND INTRAOCULAR LENS PLACEMENT (IOC) Diabetic Right (Right) - DIABETES-INSULIN DEPENDENT  Patient Location: PACU  Anesthesia Type: MAC  Level of Consciousness: awake, alert  and patient cooperative  Airway and Oxygen Therapy: Patient Spontanous Breathing and Patient connected to supplemental oxygen  Post-op Assessment: Post-op Vital signs reviewed, Patient's Cardiovascular Status Stable, Respiratory Function Stable, Patent Airway and No signs of Nausea or vomiting  Post-op Vital Signs: Reviewed and stable  Complications: No apparent anesthesia complications

## 2016-07-14 NOTE — Anesthesia Postprocedure Evaluation (Signed)
Anesthesia Post Note  Patient: Darryl Carter  Procedure(s) Performed: Procedure(s) (LRB): CATARACT EXTRACTION PHACO AND INTRAOCULAR LENS PLACEMENT (IOC) Diabetic Right (Right)  Patient location during evaluation: PACU Anesthesia Type: MAC Level of consciousness: awake Pain management: pain level controlled Vital Signs Assessment: post-procedure vital signs reviewed and stable Respiratory status: spontaneous breathing Cardiovascular status: blood pressure returned to baseline Postop Assessment: no headache Anesthetic complications: no    Jaci Standard, III,  Tavyn Kurka D

## 2016-07-15 ENCOUNTER — Encounter: Payer: Self-pay | Admitting: Ophthalmology

## 2017-02-14 DIAGNOSIS — G3184 Mild cognitive impairment, so stated: Secondary | ICD-10-CM | POA: Insufficient documentation

## 2017-03-29 ENCOUNTER — Encounter: Payer: Self-pay | Admitting: *Deleted

## 2017-03-30 ENCOUNTER — Ambulatory Visit: Payer: Medicare Other | Admitting: Anesthesiology

## 2017-03-30 ENCOUNTER — Ambulatory Visit
Admission: RE | Admit: 2017-03-30 | Discharge: 2017-03-30 | Disposition: A | Discharge status: Home / Self Care | Payer: Medicare Other | Source: Ambulatory Visit | Attending: Internal Medicine | Admitting: Internal Medicine

## 2017-03-30 ENCOUNTER — Encounter
Admission: RE | Disposition: A | Discharge status: Home / Self Care | Payer: Self-pay | Source: Ambulatory Visit | Attending: Internal Medicine

## 2017-03-30 DIAGNOSIS — Z1211 Encounter for screening for malignant neoplasm of colon: Secondary | ICD-10-CM | POA: Diagnosis present

## 2017-03-30 DIAGNOSIS — Z87891 Personal history of nicotine dependence: Secondary | ICD-10-CM | POA: Diagnosis not present

## 2017-03-30 DIAGNOSIS — K621 Rectal polyp: Secondary | ICD-10-CM | POA: Diagnosis not present

## 2017-03-30 DIAGNOSIS — K640 First degree hemorrhoids: Secondary | ICD-10-CM | POA: Insufficient documentation

## 2017-03-30 DIAGNOSIS — R011 Cardiac murmur, unspecified: Secondary | ICD-10-CM | POA: Insufficient documentation

## 2017-03-30 DIAGNOSIS — Z79899 Other long term (current) drug therapy: Secondary | ICD-10-CM | POA: Diagnosis not present

## 2017-03-30 DIAGNOSIS — Z7902 Long term (current) use of antithrombotics/antiplatelets: Secondary | ICD-10-CM | POA: Diagnosis not present

## 2017-03-30 DIAGNOSIS — K635 Polyp of colon: Secondary | ICD-10-CM | POA: Diagnosis not present

## 2017-03-30 DIAGNOSIS — Z8673 Personal history of transient ischemic attack (TIA), and cerebral infarction without residual deficits: Secondary | ICD-10-CM | POA: Insufficient documentation

## 2017-03-30 DIAGNOSIS — I10 Essential (primary) hypertension: Secondary | ICD-10-CM | POA: Insufficient documentation

## 2017-03-30 DIAGNOSIS — Z794 Long term (current) use of insulin: Secondary | ICD-10-CM | POA: Insufficient documentation

## 2017-03-30 DIAGNOSIS — E119 Type 2 diabetes mellitus without complications: Secondary | ICD-10-CM | POA: Diagnosis not present

## 2017-03-30 DIAGNOSIS — E7800 Pure hypercholesterolemia, unspecified: Secondary | ICD-10-CM | POA: Diagnosis not present

## 2017-03-30 DIAGNOSIS — Z800 Family history of malignant neoplasm of digestive organs: Secondary | ICD-10-CM | POA: Insufficient documentation

## 2017-03-30 HISTORY — DX: Disorder of kidney and ureter, unspecified: N28.9

## 2017-03-30 HISTORY — PX: COLONOSCOPY WITH PROPOFOL: SHX5780

## 2017-03-30 LAB — GLUCOSE, CAPILLARY: Glucose-Capillary: 89 mg/dL (ref 65–99)

## 2017-03-30 SURGERY — COLONOSCOPY WITH PROPOFOL
Anesthesia: General

## 2017-03-30 MED ORDER — PROPOFOL 10 MG/ML IV BOLUS
INTRAVENOUS | Status: AC
  Filled 2017-03-30: qty 20

## 2017-03-30 MED ORDER — SODIUM CHLORIDE 0.9 % IV SOLN: INTRAVENOUS | Status: DC

## 2017-03-30 MED ORDER — PROPOFOL 500 MG/50ML IV EMUL
INTRAVENOUS | Status: DC | PRN
  Administered 2017-03-30: 75 ug/kg/min via INTRAVENOUS

## 2017-03-30 MED ORDER — LACTATED RINGERS IV SOLN
INTRAVENOUS | Status: DC | PRN
  Administered 2017-03-30: 08:00:00 via INTRAVENOUS

## 2017-03-30 NOTE — Anesthesia Post-op Follow-up Note (Signed)
Anesthesia QCDR form completed.        

## 2017-03-30 NOTE — H&P (Signed)
Outpatient short stay form Pre-procedure 03/30/2017 7:50 AM Darryl Carter, M.D.  Primary Physician: [name], NP  Reason for visit:  Family hx of colon cancer, increased risk colon cancer screening.   History of present illness:  Patient presents for colonoscopy for screening/surveillance. The patient denies complaints of abdominal pain, significant change in bowel habits, or rectal bleeding. His sister died earlier this year of colon cancer.    Current Facility-Administered Medications:  .  0.9 %  sodium chloride infusion, , Intravenous, Continuous, [place], [name], MD  Medications Prior to Admission  Medication Sig Dispense Refill Last Dose  . amLODipine (NORVASC) 10 MG tablet Take 1 tablet (10 mg total) by mouth daily. (Patient taking differently: Take 10 mg by mouth daily. PM) 90 tablet 1 03/29/2017 at Unknown time  . atorvastatin (LIPITOR) 80 MG tablet Take 1 tablet (80 mg total) by mouth daily at 6 PM. 90 tablet 1 03/29/2017 at Unknown time  . enalapril (VASOTEC) 20 MG tablet Take 1 tablet (20 mg total) by mouth 2 (two) times daily. (Patient taking differently: Take 20 mg by mouth 2 (two) times daily. MORNING AND NIGHT) 180 tablet 1 03/30/2017 at Unknown time  . insulin NPH Human (NOVOLIN N) 100 UNIT/ML injection 10 units at bedtime   03/29/2017 at Unknown time  . insulin regular (NOVOLIN R,HUMULIN R) 100 units/mL injection Inject 10 Units into the skin 3 (three) times daily before meals.    03/29/2017 at Unknown time  . metoprolol (LOPRESSOR) 50 MG tablet Take 1 tablet (50 mg total) by mouth 2 (two) times daily. 180 tablet 1 03/30/2017 at Unknown time  . vitamin C (ASCORBIC ACID) 500 MG tablet Take 500 mg by mouth daily. AM   03/29/2017 at Unknown time  . clopidogrel (PLAVIX) 75 MG tablet Take 1 tablet (75 mg total) by mouth daily. (Patient taking differently: Take 75 mg by mouth daily. PM) 90 tablet 3 03/26/2017  . hydrochlorothiazide (HYDRODIURIL) 25 MG tablet Take 1 tablet (25 mg  total) by mouth daily. (Patient taking differently: Take 25 mg by mouth daily. PM) 90 tablet 1 03/28/2017  . Insulin Syringe-Needle U-100 31G X 15/64" 1 ML MISC    07/13/2016 at Unknown time  . metFORMIN (GLUCOPHAGE) 1000 MG tablet Take 1 tablet by mouth daily with breakfast. BEFORE BREAKFAST AND BEDTIME   03/28/2017     No Known Allergies   Past Medical History:  Diagnosis Date  . Diabetes mellitus without complication (HCC)    INSULIN  . Dysrhythmia   . Heart murmur   . Hypercholesterolemia   . Hypertension    CONTROLLED ON MEDS  . Renal insufficiency   . 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 Wears dentures    UPPER AND LOWER    Review of systems:      Physical Exam  General appearance: alert, cooperative and appears stated age Resp: clear to auscultation bilaterally Cardio: regular rate and rhythm, S1, S2 normal, no murmur, click, rub or gallop GI: soft, non-tender; bowel sounds normal; no masses,  no organomegaly Extremities: extremities normal, atraumatic, no cyanosis or edema     Planned procedures: Colonoscopy. The patient understands the nature of the planned procedure, indications, risks, alternatives and potential complications including but not limited to bleeding, infection, perforation, damage to internal organs and possible oversedation/side effects from anesthesia. The patient agrees and gives consent to proceed.  Please refer to procedure notes for findings, recommendations and patient disposition/instructions.    Darryl Carter,  M.D. Gastroenterology 03/30/2017  7:50 AM

## 2017-03-30 NOTE — Anesthesia Postprocedure Evaluation (Signed)
Anesthesia Post Note  Patient: Darryl Carter  Procedure(s) Performed: COLONOSCOPY WITH PROPOFOL (N/A )  Patient location during evaluation: PACU Anesthesia Type: General Level of consciousness: awake Pain management: satisfactory to patient Vital Signs Assessment: post-procedure vital signs reviewed and stable Respiratory status: spontaneous breathing Cardiovascular status: blood pressure returned to baseline Anesthetic complications: no     Last Vitals:  Vitals:   03/30/17 0711 03/30/17 0827  BP: (!) 154/73 119/60  Pulse: 69 (!) 57  Resp: 18 (!) 22  Temp: (!) 36.3 C (!) 36.3 C  SpO2: 99% 100%    Last Pain:  Vitals:   03/30/17 0827  TempSrc:   PainSc: 0-No pain                 Philbert Riser

## 2017-03-30 NOTE — Interval H&P Note (Signed)
History and Physical Interval Note:  03/30/2017 7:52 AM  Darryl Carter  has presented today for surgery, with the diagnosis of fh colon ca  The various methods of treatment have been discussed with the patient and family. After consideration of risks, benefits and other options for treatment, the patient has consented to  Procedure(s): COLONOSCOPY WITH PROPOFOL (N/A) as a surgical intervention .  The patient's history has been reviewed, patient examined, no change in status, stable for surgery.  I have reviewed the patient's chart and labs.  Questions were answered to the patient's satisfaction.     [place], [place]

## 2017-03-30 NOTE — Transfer of Care (Signed)
Immediate Anesthesia Transfer of Care Note  Patient: Darryl Carter  Procedure(s) Performed: COLONOSCOPY WITH PROPOFOL (N/A )

## 2017-03-30 NOTE — Anesthesia Preprocedure Evaluation (Signed)
Anesthesia Evaluation  Patient identified by MRN, date of birth, ID band Patient awake    Reviewed: Allergy & Precautions, H&P , NPO status , Patient's Chart, lab work & pertinent test results, reviewed documented beta blocker date and time   Airway Mallampati: II  TM Distance: >3 FB Neck ROM: full    Dental  (+) Upper Dentures, Lower Dentures   Pulmonary former smoker,    Pulmonary exam normal        Cardiovascular hypertension, On Medications Normal cardiovascular exam     Neuro/Psych CVA, No Residual Symptoms    GI/Hepatic negative GI ROS, Neg liver ROS,   Endo/Other  diabetes, Well Controlled, Type 2, Insulin Dependent  Renal/GU Renal InsufficiencyRenal disease  negative genitourinary   Musculoskeletal   Abdominal   Peds  Hematology negative hematology ROS (+)   Anesthesia Other Findings   Reproductive/Obstetrics negative OB ROS                             Anesthesia Physical  Anesthesia Plan  ASA: II  Anesthesia Plan: General   Post-op Pain Management:    Induction: Intravenous  PONV Risk Score and Plan:   Airway Management Planned: Nasal Cannula  Additional Equipment:   Intra-op Plan:   Post-operative Plan:   Informed Consent: I have reviewed the patients History and Physical, chart, labs and discussed the procedure including the risks, benefits and alternatives for the proposed anesthesia with the patient or authorized representative who has indicated his/her understanding and acceptance.   Dental advisory given  Plan Discussed with: CRNA and Surgeon  Anesthesia Plan Comments:         Anesthesia Quick Evaluation

## 2017-03-30 NOTE — Op Note (Signed)
[facility] Patient Name: Darryl Carter Procedure Date: 03/30/2017 7:50 AM MRN: [id number] Account #: [id number] Date of Birth: Mar 23, 1940 Admit Type: Outpatient Age: 77 Room: [facility] ENDO ROOM 3 Gender: Male Note Status: Finalized Procedure:            Colonoscopy Indications:          Screening patient at increased risk: Family history of                        1st-degree relative with colorectal cancer at age 48                        years (or older) Providers:            Teodoro K. Alice Reichert MD, MD Referring MD:         Juluis Rainier (Referring MD) Medicines:            Propofol per Anesthesia Complications:        No immediate complications. Procedure:            Pre-Anesthesia Assessment:                       - The risks and benefits of the procedure and the                        sedation options and risks were discussed with the                        patient. All questions were answered and informed                        consent was obtained.                       - Patient identification and proposed procedure were                        verified prior to the procedure by the nurse. The                        procedure was verified in the procedure room.                       - ASA Grade Assessment: II - A patient with mild                        systemic disease.                       - After reviewing the risks and benefits, the patient                        was deemed in satisfactory condition to undergo the                        procedure.                       After obtaining informed consent, the colonoscope was  passed under direct vision. Throughout the procedure,                        the patient's blood pressure, pulse, and oxygen                        saturations were monitored continuously. The                        Colonoscope was introduced through the anus and                        advanced to the  the cecum, identified by appendiceal                        orifice and ileocecal valve. The colonoscopy was                        somewhat difficult due to restricted mobility of the                        colon. Successful completion of the procedure was aided                        by using manual pressure. The patient tolerated the                        procedure well. The quality of the bowel preparation                        was good. The ileocecal valve, appendiceal orifice, and                        rectum were photographed. Findings:      The perianal and digital rectal examinations were normal. Pertinent       negatives include normal sphincter tone and no palpable rectal lesions.      A 4 mm polyp was found in the ileocecal valve. The polyp was sessile.       The polyp was removed with a cold snare. Resection and retrieval were       complete.      A 6 mm polyp was found in the rectum. The polyp was semi-pedunculated.       The polyp was removed with a hot snare. Resection and retrieval were       complete.      Non-bleeding internal hemorrhoids were found during retroflexion. The       hemorrhoids were Grade I (internal hemorrhoids that do not prolapse).      The exam was otherwise without abnormality. Impression:           - One 4 mm polyp at the ileocecal valve, removed with a                        cold snare. Resected and retrieved.                       - One 6 mm polyp in the rectum, removed with a hot  snare. Resected and retrieved.                       - Non-bleeding internal hemorrhoids.                       - The examination was otherwise normal. Recommendation:       - Patient has a contact number available for                        emergencies. The signs and symptoms of potential                        delayed complications were discussed with the patient.                        Return to normal activities tomorrow. Written discharge                         instructions were provided to the patient.                       - Resume previous diet.                       - Continue present medications.                       - Resume Plavix (clopidogrel) at prior dose tomorrow.                        Refer to managing physician for further adjustment of                        therapy.                       - Repeat colonoscopy for surveillance based on                        pathology results.                       - The findings and recommendations were discussed with                        the patient and their spouse.                       - Return to GI office PRN. Procedure Code(s):    --- Professional ---                       [phone], Colonoscopy, flexible; with removal of tumor(s),                        polyp(s), or other lesion(s) by snare technique Diagnosis Code(s):    --- Professional ---                       Z80.0, Family history of malignant neoplasm of                        digestive organs  D12.0, Benign neoplasm of cecum                       K62.1, Rectal polyp                       K64.0, First degree hemorrhoids CPT copyright 2016 [employer]. All rights reserved. The codes documented in this report are preliminary and upon coder review may  be revised to meet current compliance requirements. Efrain Sella MD, MD 03/30/2017 8:24:28 AM This report has been signed electronically. Number of Addenda: 0 Note Initiated On: 03/30/2017 7:50 AM Scope Withdrawal Time: 0 hours 17 minutes 14 seconds  Total Procedure Duration: 0 hours 22 minutes 43 seconds       [facility]

## 2017-03-31 ENCOUNTER — Encounter: Payer: Self-pay | Admitting: Internal Medicine

## 2017-03-31 LAB — SURGICAL PATHOLOGY

## 2017-04-19 DIAGNOSIS — Z800 Family history of malignant neoplasm of digestive organs: Secondary | ICD-10-CM | POA: Insufficient documentation

## 2018-02-05 DIAGNOSIS — D649 Anemia, unspecified: Secondary | ICD-10-CM | POA: Insufficient documentation

## 2018-02-05 DIAGNOSIS — Z79899 Other long term (current) drug therapy: Secondary | ICD-10-CM | POA: Insufficient documentation

## 2018-02-06 DIAGNOSIS — E538 Deficiency of other specified B group vitamins: Secondary | ICD-10-CM | POA: Insufficient documentation

## 2018-03-03 ENCOUNTER — Encounter: Payer: Self-pay | Admitting: Urology

## 2018-03-03 ENCOUNTER — Other Ambulatory Visit: Payer: Self-pay

## 2018-03-03 ENCOUNTER — Other Ambulatory Visit
Admission: RE | Admit: 2018-03-03 | Discharge: 2018-03-03 | Disposition: A | Discharge status: Home / Self Care | Payer: Medicare Other | Attending: Urology | Admitting: Urology

## 2018-03-03 ENCOUNTER — Ambulatory Visit (INDEPENDENT_AMBULATORY_CARE_PROVIDER_SITE_OTHER): Payer: Medicare Other | Admitting: Urology

## 2018-03-03 VITALS — BP 180/84 | HR 61 | Ht 66.5 in | Wt 180.0 lb

## 2018-03-03 DIAGNOSIS — N401 Enlarged prostate with lower urinary tract symptoms: Secondary | ICD-10-CM | POA: Diagnosis not present

## 2018-03-03 DIAGNOSIS — R32 Unspecified urinary incontinence: Secondary | ICD-10-CM | POA: Diagnosis present

## 2018-03-03 DIAGNOSIS — Y92009 Unspecified place in unspecified non-institutional (private) residence as the place of occurrence of the external cause: Secondary | ICD-10-CM

## 2018-03-03 DIAGNOSIS — R0989 Other specified symptoms and signs involving the circulatory and respiratory systems: Secondary | ICD-10-CM | POA: Insufficient documentation

## 2018-03-03 DIAGNOSIS — M545 Low back pain, unspecified: Secondary | ICD-10-CM | POA: Insufficient documentation

## 2018-03-03 DIAGNOSIS — I639 Cerebral infarction, unspecified: Secondary | ICD-10-CM | POA: Insufficient documentation

## 2018-03-03 DIAGNOSIS — R339 Retention of urine, unspecified: Secondary | ICD-10-CM | POA: Diagnosis not present

## 2018-03-03 DIAGNOSIS — Z23 Encounter for immunization: Secondary | ICD-10-CM | POA: Insufficient documentation

## 2018-03-03 DIAGNOSIS — R0982 Postnasal drip: Secondary | ICD-10-CM | POA: Insufficient documentation

## 2018-03-03 DIAGNOSIS — Z9181 History of falling: Secondary | ICD-10-CM | POA: Insufficient documentation

## 2018-03-03 DIAGNOSIS — R351 Nocturia: Secondary | ICD-10-CM | POA: Diagnosis not present

## 2018-03-03 DIAGNOSIS — I459 Conduction disorder, unspecified: Secondary | ICD-10-CM | POA: Insufficient documentation

## 2018-03-03 DIAGNOSIS — W19XXXA Unspecified fall, initial encounter: Secondary | ICD-10-CM | POA: Insufficient documentation

## 2018-03-03 DIAGNOSIS — N138 Other obstructive and reflux uropathy: Secondary | ICD-10-CM

## 2018-03-03 DIAGNOSIS — N3941 Urge incontinence: Secondary | ICD-10-CM

## 2018-03-03 LAB — URINALYSIS, COMPLETE (UACMP) WITH MICROSCOPIC
Bacteria, UA: NONE SEEN
Bilirubin Urine: NEGATIVE
Glucose, UA: 500 mg/dL — AB
Ketones, ur: NEGATIVE mg/dL
Leukocytes, UA: NEGATIVE
Nitrite: NEGATIVE
Protein, ur: >300 mg/dL — AB
RBC / HPF: NONE SEEN RBC/hpf (ref 0–5)
Specific Gravity, Urine: 1.025 (ref 1.005–1.030)
Squamous Epithelial / HPF: NONE SEEN (ref 0–5)
pH: 5.5 (ref 5.0–8.0)

## 2018-03-03 LAB — BLADDER SCAN AMB NON-IMAGING

## 2018-03-03 MED ORDER — FINASTERIDE 5 MG PO TABS: 5.0000 mg | ORAL_TABLET | Freq: Every day | ORAL | 11 refills | Status: AC

## 2018-03-03 MED ORDER — TAMSULOSIN HCL 0.4 MG PO CAPS: 0.4000 mg | ORAL_CAPSULE | Freq: Every day | ORAL | 11 refills | Status: DC

## 2018-03-03 NOTE — Progress Notes (Signed)
03/03/2018 3:46 PM   Darryl Carter 01-04-1941 [id number]  Referring provider: Sallee Lange, NP [address] [place], [place] [address]  Chief Complaint  Patient presents with  . Urinary Incontinence    HPI: 77 year old male referred for further evaluation of voiding symptoms.  He is mostly bothered by nocturia x3-4.  He has daytime weak stream, straining to void, and sensation on incomplete emptying.  He does have occasional accidents with urgency and inability to get to the bathroom on time.  He is now wearing depends for incontinence episodes.    No dysuria or gross hematuria.  No UTIs or bladder stones.   No previous urologist evaluation.  No currently on any BPH meds and never previously tried these medications.     He drinks 2 cups of coffee daily including 1 evening.    Most recent PSA 3.43 on 10/2016.  He does have multiple medical comorbidities including a history of diabetes, stroke, cognitive imparment amongst others.  PVR today is elevated to 176 cc.  He does feel that he could void again and empty better.    IPSS    Row Name 03/03/18 1400         International Prostate Symptom Score   How often have you had the sensation of not emptying your bladder?  About half the time     How often have you had to urinate less than every two hours?  About half the time     How often have you found you stopped and started again several times when you urinated?  Less than half the time     How often have you found it difficult to postpone urination?  Less than half the time     How often have you had a weak urinary stream?  Not at All     How often have you had to strain to start urination?  Less than 1 in 5 times     How many times did you typically get up at night to urinate?  5 Times     Total IPSS Score  16       Quality of Life due to urinary symptoms   If you were to spend the rest of your life with your urinary condition just the way it is now how would you  feel about that?  Mostly Disatisfied        Score:  1-7 Mild 8-19 Moderate 20-35 Severe    PMH: Past Medical History:  Diagnosis Date  . Diabetes mellitus without complication (HCC)    INSULIN  . Dysrhythmia   . Heart murmur   . Hypercholesterolemia   . Hypertension    CONTROLLED ON MEDS  . Renal insufficiency   . Stroke [facility])    CVA '05  . Wears dentures    UPPER AND LOWER    Surgical History: Past Surgical History:  Procedure Laterality Date  . CATARACT EXTRACTION Left 2010  . CATARACT EXTRACTION W/PHACO Right 07/14/2016   Procedure: CATARACT EXTRACTION PHACO AND INTRAOCULAR LENS PLACEMENT ([place]) Diabetic Right;  Surgeon: Leandrew Koyanagi, MD;  Location: [place];  Service: Ophthalmology;  Laterality: Right;  DIABETES-INSULIN DEPENDENT  . COLONOSCOPY    . COLONOSCOPY WITH PROPOFOL N/A 03/30/2017   Procedure: COLONOSCOPY WITH PROPOFOL;  Surgeon: Toledo, [name], MD;  Location: [facility];  Service: Gastroenterology;  Laterality: N/A;    Home Medications:  Allergies as of 03/03/2018      Reactions  Donepezil Diarrhea      Medication List       Accurate as of March 03, 2018 11:59 PM. Always use your most recent med list.        amLODipine 10 MG tablet Commonly known as:  NORVASC Take 1 tablet (10 mg total) by mouth daily.   atorvastatin 80 MG tablet Commonly known as:  LIPITOR Take 1 tablet (80 mg total) by mouth daily at 6 PM.   clopidogrel 75 MG tablet Commonly known as:  PLAVIX Take 1 tablet (75 mg total) by mouth daily.   ELDERBERRY PO Take by mouth daily.   enalapril 20 MG tablet Commonly known as:  VASOTEC Take 1 tablet (20 mg total) by mouth 2 (two) times daily.   finasteride 5 MG tablet Commonly known as:  PROSCAR Take 1 tablet (5 mg total) by mouth daily.   hydrochlorothiazide 25 MG tablet Commonly known as:  HYDRODIURIL Take 1 tablet (25 mg total) by mouth daily.   insulin NPH-regular Human (70-30) 100 UNIT/ML  injection 30 units with breakfast and 20 units with supper   Insulin Syringe-Needle U-100 31G X 15/64" 1 ML Misc   memantine 10 MG tablet Commonly known as:  NAMENDA Take 10 mg by mouth 2 (two) times daily.   metFORMIN 1000 MG tablet Commonly known as:  GLUCOPHAGE Take 1 tablet by mouth daily with breakfast. BEFORE BREAKFAST AND BEDTIME   metoprolol tartrate 50 MG tablet Commonly known as:  LOPRESSOR Take 1 tablet (50 mg total) by mouth 2 (two) times daily.   tamsulosin 0.4 MG Caps capsule Commonly known as:  FLOMAX Take 1 capsule (0.4 mg total) by mouth daily.   vitamin C 500 MG tablet Commonly known as:  ASCORBIC ACID Take 500 mg by mouth daily. AM       Allergies:  Allergies  Allergen Reactions  . Donepezil Diarrhea    Family History: Family History  Problem Relation Age of Onset  . Hypertension Mother   . Diabetes Mother   . Hypertension Father   . Diabetes Father   . Hypertension Sister   . Diabetes Sister   . Hypertension Brother   . Diabetes Brother     Social History:  reports that he quit smoking about 16 years ago. His smoking use included cigarettes. He has a 68.00 pack-year smoking history. He has never used smokeless tobacco. He reports that he does not drink alcohol or use drugs.  ROS: UROLOGY Frequent Urination?: Yes Hard to postpone urination?: No Burning/pain with urination?: No Get up at night to urinate?: Yes Leakage of urine?: Yes Urine stream starts and stops?: Yes Trouble starting stream?: No Do you have to strain to urinate?: No Blood in urine?: No Urinary tract infection?: No Sexually transmitted disease?: No Injury to kidneys or bladder?: No Painful intercourse?: No Weak stream?: No Erection problems?: Yes Penile pain?: No  Gastrointestinal Nausea?: No Vomiting?: No Indigestion/heartburn?: No Diarrhea?: No Constipation?: No  Constitutional Fever: No Night sweats?: No Weight loss?: No Fatigue?: No  Skin Skin  rash/lesions?: No Itching?: No  Eyes Blurred vision?: No Double vision?: No  Ears/Nose/Throat Sore throat?: No Sinus problems?: No  Hematologic/Lymphatic Swollen glands?: No Easy bruising?: No  Cardiovascular Leg swelling?: No Chest pain?: No  Respiratory Cough?: No Shortness of breath?: No  Endocrine Excessive thirst?: No  Musculoskeletal Back pain?: No Joint pain?: No  Neurological Headaches?: No Dizziness?: No  Psychologic Depression?: No Anxiety?: No  Physical Exam: BP (!) 180/84   Pulse  61   Ht 5' 6.5" (1.689 m)   Wt 180 lb (81.6 kg)   BMI 28.62 kg/m   Constitutional:  Alert and oriented, No acute distress.   HEENT: [place] AT, moist mucus membranes.  Trachea midline, no masses. Cardiovascular: No clubbing, cyanosis, or edema. Respiratory: Normal respiratory effort, no increased work of breathing. GI: Abdomen is soft, nontender, nondistended, no abdominal masses GU: No CVA tenderness Rectal: Normal sphincter tone.  Mildly enlarged prostate, nonnodular. Skin: No rashes, bruises or suspicious lesions. Neurologic: Grossly intact, no focal deficits, moving all 4 extremities. Psychiatric: Normal mood and affect.  Laboratory Data: Creatinine 1.2 on 12/2017 Hemoglobin A1c 10.6  Urinalysis Component     Latest Ref Rng & Units 03/03/2018  Color, Urine     YELLOW YELLOW  Appearance     CLEAR CLEAR  Specific Gravity, Urine     1.005 - 1.030 1.025  pH     5.0 - 8.0 5.5  Glucose, UA     NEGATIVE mg/dL 500 (A)  Hgb urine dipstick     NEGATIVE TRACE (A)  Bilirubin Urine     NEGATIVE NEGATIVE  Ketones, ur     NEGATIVE mg/dL NEGATIVE  Protein     NEGATIVE mg/dL >300 (A)  Nitrite     NEGATIVE NEGATIVE  Leukocytes, UA     NEGATIVE NEGATIVE  Squamous Epithelial / LPF     0 - 5 NONE SEEN  WBC, UA     0 - 5 WBC/hpf 0-5  RBC / HPF     0 - 5 RBC/hpf NONE SEEN  Bacteria, UA     NONE SEEN NONE SEEN    Pertinent Imaging: Results for orders placed  or performed during the hospital encounter of 03/03/18  Urinalysis, Complete w Microscopic  Result Value Ref Range   Color, Urine YELLOW YELLOW   APPearance CLEAR CLEAR   Specific Gravity, Urine 1.025 1.005 - 1.030   pH 5.5 5.0 - 8.0   Glucose, UA 500 (A) NEGATIVE mg/dL   Hgb urine dipstick TRACE (A) NEGATIVE   Bilirubin Urine NEGATIVE NEGATIVE   Ketones, ur NEGATIVE NEGATIVE mg/dL   Protein, ur >300 (A) NEGATIVE mg/dL   Nitrite NEGATIVE NEGATIVE   Leukocytes, UA NEGATIVE NEGATIVE   Squamous Epithelial / LPF NONE SEEN 0 - 5   WBC, UA 0-5 0 - 5 WBC/hpf   RBC / HPF NONE SEEN 0 - 5 RBC/hpf   Bacteria, UA NONE SEEN NONE SEEN    Assessment & Plan:    1. BPH with urinary obstruction Discussed options for treatment of his obstructive urinary symptoms including addition of pharmacotherapy such as Flomax and/or finasteride versus surgical intervention He is [facility] pharmacotherapy We will trial Flomax for the next 3 months and reassess Discussed possible side effects of medication including orthostatic hypotension and retrograde ejaculation. - Bladder Scan (Post Void Residual) in office  2. Incomplete bladder emptying Mildly elevated PVR, patient was able to urinate again this bladder scan was not reflective of true PVR Urged doubling time voiding as needed  3. Urge incontinence of urine Will consider additional pharmacotherapy but like to address obstructive voiding symptoms prior to treating urgency and urge incontinence  4. Nocturia Behavioral modification discussed Likely multifactorial We will treat #1 and reassess   Return in about 3 months (around 06/02/2018) for IPSS/ PVR.  Hollice Espy, MD  [facility] [address] [place], [place] [address] [phone]

## 2018-03-27 ENCOUNTER — Encounter: Payer: Self-pay | Admitting: Urology

## 2018-06-02 ENCOUNTER — Encounter: Payer: Self-pay | Admitting: Urology

## 2018-06-02 ENCOUNTER — Ambulatory Visit: Payer: Medicare Other | Admitting: Urology

## 2018-06-02 ENCOUNTER — Other Ambulatory Visit: Payer: Self-pay

## 2018-06-02 VITALS — BP 134/71 | HR 62 | Ht 66.0 in | Wt 188.0 lb

## 2018-06-02 DIAGNOSIS — N138 Other obstructive and reflux uropathy: Secondary | ICD-10-CM

## 2018-06-02 DIAGNOSIS — N401 Enlarged prostate with lower urinary tract symptoms: Secondary | ICD-10-CM | POA: Diagnosis not present

## 2018-06-02 LAB — BLADDER SCAN AMB NON-IMAGING

## 2018-06-02 NOTE — Progress Notes (Signed)
06/02/2018 1:12 PM   Darryl Carter 03-Oct-1940 [id number]  Referring provider: Sallee Lange, NP [address] [place], [place] [address]  Chief Complaint  Patient presents with  . Benign Prostatic Hypertrophy    HPI: 78 year old male who returns today for 91-month follow-up.  See previous notes for details.    He has a personal history of BPH and was started on Flomax and finasteride 3 months ago.    Today, he reports that he is doing very well.  His symptoms well controlled.  He is very pleased with the medications without side effects.    IPSS as below.    PVR today previously 176 cc, improved today to 37 cc.    IPSS    Row Name 06/02/18 1000         International Prostate Symptom Score   How often have you had the sensation of not emptying your bladder?  Not at All     How often have you had to urinate less than every two hours?  Less than 1 in 5 times     How often have you found you stopped and started again several times when you urinated?  Not at All     How often have you found it difficult to postpone urination?  Not at All     How often have you had a weak urinary stream?  Not at All     How often have you had to strain to start urination?  Not at All     How many times did you typically get up at night to urinate?  1 Time     Total IPSS Score  2       Quality of Life due to urinary symptoms   If you were to spend the rest of your life with your urinary condition just the way it is now how would you feel about that?  Pleased        Score:  1-7 Mild 8-19 Moderate 20-35 Severe  PMH: Past Medical History:  Diagnosis Date  . Diabetes mellitus without complication (HCC)    INSULIN  . Dysrhythmia   . Heart murmur   . Hypercholesterolemia   . Hypertension    CONTROLLED ON MEDS  . Renal insufficiency   . Stroke [facility])    CVA '05  . Wears dentures    UPPER AND LOWER    Surgical History: Past Surgical History:  Procedure Laterality Date  .  CATARACT EXTRACTION Left 2010  . CATARACT EXTRACTION W/PHACO Right 07/14/2016   Procedure: CATARACT EXTRACTION PHACO AND INTRAOCULAR LENS PLACEMENT ([place]) Diabetic Right;  Surgeon: Leandrew Koyanagi, MD;  Location: [place];  Service: Ophthalmology;  Laterality: Right;  DIABETES-INSULIN DEPENDENT  . COLONOSCOPY    . COLONOSCOPY WITH PROPOFOL N/A 03/30/2017   Procedure: COLONOSCOPY WITH PROPOFOL;  Surgeon: Toledo, [name], MD;  Location: [facility];  Service: Gastroenterology;  Laterality: N/A;    Home Medications:  Allergies as of 06/02/2018      Reactions   Donepezil Diarrhea      Medication List       Accurate as of June 02, 2018  1:12 PM. Always use your most recent med list.        amLODipine 10 MG tablet Commonly known as:  NORVASC Take 1 tablet (10 mg total) by mouth daily.   atorvastatin 80 MG tablet Commonly known as:  LIPITOR Take 1 tablet (80 mg total) by mouth daily at  6 PM.   clopidogrel 75 MG tablet Commonly known as:  PLAVIX Take 1 tablet (75 mg total) by mouth daily.   ELDERBERRY PO Take by mouth daily.   enalapril 20 MG tablet Commonly known as:  VASOTEC Take 1 tablet (20 mg total) by mouth 2 (two) times daily.   finasteride 5 MG tablet Commonly known as:  PROSCAR Take 1 tablet (5 mg total) by mouth daily.   hydrochlorothiazide 25 MG tablet Commonly known as:  HYDRODIURIL Take 1 tablet (25 mg total) by mouth daily.   insulin NPH-regular Human (70-30) 100 UNIT/ML injection 30 units with breakfast and 20 units with supper   Insulin Syringe-Needle U-100 31G X 15/64" 1 ML Misc   memantine 10 MG tablet Commonly known as:  NAMENDA Take 10 mg by mouth 2 (two) times daily.   metFORMIN 1000 MG tablet Commonly known as:  GLUCOPHAGE Take 1 tablet by mouth daily with breakfast. BEFORE BREAKFAST AND BEDTIME   metoprolol tartrate 50 MG tablet Commonly known as:  LOPRESSOR Take 1 tablet (50 mg total) by mouth 2 (two) times daily.    tamsulosin 0.4 MG Caps capsule Commonly known as:  Flomax Take 1 capsule (0.4 mg total) by mouth daily.   vitamin C 500 MG tablet Commonly known as:  ASCORBIC ACID Take 500 mg by mouth daily. AM       Allergies:  Allergies  Allergen Reactions  . Donepezil Diarrhea    Family History: Family History  Problem Relation Age of Onset  . Hypertension Mother   . Diabetes Mother   . Hypertension Father   . Diabetes Father   . Hypertension Sister   . Diabetes Sister   . Hypertension Brother   . Diabetes Brother     Social History:  reports that he quit smoking about 17 years ago. His smoking use included cigarettes. He has a 68.00 pack-year smoking history. He has never used smokeless tobacco. He reports that he does not drink alcohol or use drugs.  ROS: UROLOGY Frequent Urination?: No Hard to postpone urination?: No Burning/pain with urination?: No Get up at night to urinate?: Yes Leakage of urine?: Yes Urine stream starts and stops?: No Trouble starting stream?: No Do you have to strain to urinate?: No Blood in urine?: No Urinary tract infection?: No Sexually transmitted disease?: No Injury to kidneys or bladder?: No Painful intercourse?: No Weak stream?: No Erection problems?: Yes Penile pain?: No  Gastrointestinal Nausea?: No Vomiting?: No Indigestion/heartburn?: Yes Diarrhea?: No Constipation?: No  Constitutional Fever: No Night sweats?: No Weight loss?: No Fatigue?: No  Skin Skin rash/lesions?: No Itching?: No  Eyes Blurred vision?: No Double vision?: No  Ears/Nose/Throat Sore throat?: No Sinus problems?: No  Hematologic/Lymphatic Swollen glands?: No Easy bruising?: No  Cardiovascular Leg swelling?: No Chest pain?: No  Respiratory Cough?: No Shortness of breath?: No  Endocrine Excessive thirst?: No  Musculoskeletal Back pain?: No Joint pain?: No  Neurological Headaches?: No Dizziness?: No  Psychologic Depression?: No  Anxiety?: No  Physical Exam: BP 134/71 (BP Location: Left Arm, Patient Position: Sitting)   Pulse 62   Ht 5\' 6"  (1.676 m)   Wt 188 lb (85.3 kg)   BMI 30.34 kg/m   Constitutional:  Alert and oriented, No acute distress. HEENT: [place] AT, moist mucus membranes.  Trachea midline, no masses. Cardiovascular: No clubbing, cyanosis, or edema. Respiratory: Normal respiratory effort, no increased work of breathing. GI: Abdomen is soft, nontender, nondistended, no abdominal masses GU: No CVA tenderness Skin:  No rashes, bruises or suspicious lesions. Neurologic: Grossly intact, no focal deficits, moving all 4 extremities. Psychiatric: Normal mood and affect.  Laboratory Data: Lab Results  Component Value Date   WBC 10.7 (H) 05/18/2013   HGB 13.4 05/18/2013   HCT 42.0 05/18/2013   MCV 80 05/18/2013   PLT 156 05/18/2013    Pertinent Imaging: Results for orders placed or performed in visit on 06/02/18  BLADDER SCAN AMB NON-IMAGING  Result Value Ref Range   Scan Result 67ml     Assessment & Plan:    1. BPH with urinary obstruction Doing well on flomax/ finasteride- symptoms dramatically improved Not interested in surgical intervention Emptying improved - BLADDER SCAN AMB NON-IMAGING   Return in about 1 year (around 06/02/2019) for IPSS/ PVR.  Hollice Espy, MD  [facility] [address], [place], [place] [address] [phone]

## 2018-08-23 ENCOUNTER — Other Ambulatory Visit: Payer: Self-pay | Admitting: Orthopedic Surgery

## 2018-08-23 DIAGNOSIS — M5442 Lumbago with sciatica, left side: Secondary | ICD-10-CM

## 2018-08-23 DIAGNOSIS — G8929 Other chronic pain: Secondary | ICD-10-CM

## 2018-09-02 ENCOUNTER — Other Ambulatory Visit: Payer: Self-pay

## 2018-09-02 ENCOUNTER — Ambulatory Visit
Admission: RE | Admit: 2018-09-02 | Discharge: 2018-09-02 | Disposition: A | Discharge status: Home / Self Care | Payer: Medicare Other | Source: Ambulatory Visit | Attending: Orthopedic Surgery | Admitting: Orthopedic Surgery

## 2018-09-02 DIAGNOSIS — G8929 Other chronic pain: Secondary | ICD-10-CM | POA: Diagnosis present

## 2018-09-02 DIAGNOSIS — M5441 Lumbago with sciatica, right side: Secondary | ICD-10-CM | POA: Diagnosis present

## 2018-09-02 DIAGNOSIS — M5442 Lumbago with sciatica, left side: Secondary | ICD-10-CM | POA: Diagnosis not present

## 2019-01-11 ENCOUNTER — Other Ambulatory Visit: Payer: Self-pay | Admitting: Internal Medicine

## 2019-01-11 DIAGNOSIS — R634 Abnormal weight loss: Secondary | ICD-10-CM

## 2019-01-11 DIAGNOSIS — R748 Abnormal levels of other serum enzymes: Secondary | ICD-10-CM

## 2019-01-12 ENCOUNTER — Other Ambulatory Visit: Payer: Self-pay

## 2019-01-12 DIAGNOSIS — Z20822 Contact with and (suspected) exposure to covid-19: Secondary | ICD-10-CM

## 2019-01-14 LAB — NOVEL CORONAVIRUS, NAA: SARS-CoV-2, NAA: NOT DETECTED

## 2019-01-16 ENCOUNTER — Other Ambulatory Visit: Payer: Self-pay

## 2019-01-16 ENCOUNTER — Ambulatory Visit
Admission: RE | Admit: 2019-01-16 | Discharge: 2019-01-16 | Disposition: A | Discharge status: Home / Self Care | Payer: Medicare Other | Source: Ambulatory Visit | Attending: Internal Medicine | Admitting: Internal Medicine

## 2019-01-16 ENCOUNTER — Encounter (INDEPENDENT_AMBULATORY_CARE_PROVIDER_SITE_OTHER): Payer: Self-pay

## 2019-01-16 DIAGNOSIS — R748 Abnormal levels of other serum enzymes: Secondary | ICD-10-CM | POA: Insufficient documentation

## 2019-01-16 DIAGNOSIS — R634 Abnormal weight loss: Secondary | ICD-10-CM | POA: Insufficient documentation

## 2019-01-16 MED ORDER — IOHEXOL 300 MG/ML  SOLN
100.0000 mL | Freq: Once | INTRAMUSCULAR | Status: AC | PRN
  Administered 2019-01-16: 09:00:00 80 mL via INTRAVENOUS

## 2019-01-18 NOTE — Progress Notes (Signed)
[facility]  [address], [address] [place], [place] [address] Phone: [phone]  Fax: [phone]   Clinic Day:  01/19/2019  Referring physician: Ezequiel Kayser, MD  Chief Complaint: Darryl Carter is a 78 y.o. 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   HPI: The patient has been chronically fatigued for several months.  Appetite has decreased rapidly over the past few weeks.  He has lost about 20 pounds since 07/2018.  He is spending the majority of his day in bed.  He was seen by Dr. Raechel Ache on 01/08/2019 for fatigue and cough, decreased appetite and weight loss.  Labs revealed a hematocrit of 35.2, hemoglobin 11.2, MCV 77.4, platelets 246,000, white count 11,900 (ANC 9900).  Bilirubin was 2.7 with an AST 141, ALT 195, and alkaline phosphatase 1038.  Amylase was 48 and lipase 48.  Abdomen and pelvis CT with contrast on 01/16/2019 revealed widespread malignancy with pancreas adenocarcinoma the favored primary.  There was extensive metastatic disease in the liver, upper retroperitoneum, and peritoneum.  There were numerous low-dose low-density lesions in the liver of various sizes up to 6.3 cm.  Disease involved all hepatic segments.   There was no evidence of biliary obstruction or stone.  There was a 12 x 5 cm lobulated mass spanning the length of the pancreatic body and tail.  There was encasement and likely narrowing of the splenic artery and occlusion of the splenic vein with venous collaterals.  There were enlarged lymph nodes in the peripancreatic and porta hepatis regions.  There was generalized omental nodularity with the largest deposits and along the left pericolic gutter masses measured up to 3 cm in size.   His wife notes that he only ate 5 tablespoons of food yesterday.  He has been in bed all week. He drinks if she encourages him, but otherwise, he doesn't drink every much.  He can fall asleep any time he sits down. He is able to manage his activities of dally living (dress, eat, bath and use the restroom on his own).  He is voiding less with decreased fluid intake.  Urine is dark in color.  He has stage III kidney disease for the past 5 years. He hasn't seen his nephrologist recently. He has issues with  constipation. He began Miralax yesterday.  Bowel movements are painful. He has stage III kidney disease for the past 5 years. He hasn't seen his nephrologist recently.   He stopped drinking and smoking 2 years before his stroke in 2005. His wife says he suffered very minimal effects post CVA. He takes Namenda BID for memory.  He cannot stand to get onto the exam table. He was able to walk to the car on his own and into lobby but wife noticed that he became disoriented and needed help getting into a wheel chair.  She has to physically lift his legs.  He feels very weak.    At this time, he is considering not having a biopsy for diagnosis and managing his symptoms only.  He and his wife have agreed to a direct admission for IVF and management of his recent rapid decline in health.  He is amendable to evaluation by palliative care and Hospice services.   Past Medical History:  Diagnosis Date  . Diabetes mellitus without complication (HCC)    INSULIN  . Dysrhythmia   . Heart murmur   . Hypercholesterolemia   .  Hypertension    CONTROLLED ON MEDS  . Renal insufficiency   . Stroke [facility])    CVA '05  . Wears dentures    UPPER AND LOWER    Past Surgical History:  Procedure Laterality Date  . CATARACT EXTRACTION Left 2010  . CATARACT EXTRACTION W/PHACO Right 07/14/2016   Procedure: CATARACT EXTRACTION PHACO AND INTRAOCULAR LENS PLACEMENT ([place]) Diabetic Right;  Surgeon: Leandrew Koyanagi, MD;  Location: [facility];  Service: Ophthalmology;  Laterality: Right;  DIABETES-INSULIN DEPENDENT  . COLONOSCOPY    . COLONOSCOPY WITH PROPOFOL N/A 03/30/2017    Procedure: COLONOSCOPY WITH PROPOFOL;  Surgeon: Toledo, [name], MD;  Location: [facility] ENDOSCOPY;  Service: Gastroenterology;  Laterality: N/A;    Family History  Problem Relation Age of Onset  . Hypertension Mother   . Diabetes Mother   . Hypertension Father   . Diabetes Father   . Hypertension Sister   . Diabetes Sister   . Colon cancer Sister   . Hypertension Brother   . Diabetes Brother     Social History:  reports that he quit smoking about 17 years ago. His smoking use included cigarettes. He has a 68.00 pack-year smoking history. He has never used smokeless tobacco. He reports that he does not drink alcohol or use drugs. He smoked 1 1/2 packs/day from age 47 to 56.  He worked for [employer] and retired in 2008. They moved to [place] from [place] in 2009. Denise's daughters live here in [place]. The patient is accompanied by his wife, Darryl Carter, today.  Allergies:  Allergies  Allergen Reactions  . Donepezil Diarrhea    Current Medications: Current Outpatient Medications  Medication Sig Dispense Refill  . amLODipine (NORVASC) 10 MG tablet Take 1 tablet (10 mg total) by mouth daily. (Patient taking differently: Take 10 mg by mouth daily. PM) 90 tablet 1  . atorvastatin (LIPITOR) 80 MG tablet Take 1 tablet (80 mg total) by mouth daily at 6 PM. 90 tablet 1  . clopidogrel (PLAVIX) 75 MG tablet Take 1 tablet (75 mg total) by mouth daily. (Patient taking differently: Take 75 mg by mouth daily. PM) 90 tablet 3  . enalapril (VASOTEC) 20 MG tablet Take 1 tablet (20 mg total) by mouth 2 (two) times daily. (Patient taking differently: Take 20 mg by mouth 2 (two) times daily. MORNING AND NIGHT) 180 tablet 1  . finasteride (PROSCAR) 5 MG tablet Take 1 tablet (5 mg total) by mouth daily. 30 tablet 11  . hydrochlorothiazide (HYDRODIURIL) 25 MG tablet Take 1 tablet (25 mg total) by mouth daily. (Patient taking differently: Take 25 mg by mouth daily. PM) 90 tablet 1  . insulin NPH-regular Human (70-30)  100 UNIT/ML injection Inject 20 Units into the skin 2 (two) times daily with a meal.     . Insulin Syringe-Needle U-100 31G X 15/64" 1 ML MISC     . memantine (NAMENDA) 10 MG tablet Take 10 mg by mouth 2 (two) times daily.    . metoprolol (LOPRESSOR) 50 MG tablet Take 1 tablet (50 mg total) by mouth 2 (two) times daily. 180 tablet 1  . ondansetron (ZOFRAN-ODT) 4 MG disintegrating tablet Take 4 mg by mouth every 8 (eight) hours as needed.     . tamsulosin (FLOMAX) 0.4 MG CAPS capsule Take 0.4 mg by mouth daily.     . vitamin C (ASCORBIC ACID) 500 MG tablet Take 500 mg by mouth daily. AM    . empagliflozin (JARDIANCE) 25 MG TABS tablet  Take by mouth.    . metFORMIN (GLUCOPHAGE) 1000 MG tablet Take 1 tablet by mouth daily with breakfast. BEFORE BREAKFAST AND BEDTIME     No current facility-administered medications for this visit.     Review of Systems  Constitutional: Positive for malaise/fatigue (sleeping during the day) and weight loss (20+ lbs since 07/2018). Negative for chills, diaphoresis and fever.       Extreme fatigue.  HENT: Positive for congestion. Negative for ear pain, hearing loss, nosebleeds, sinus pain, sore throat and tinnitus.        Rhinorrhea.  Losing voice.  Eyes: Negative.  Negative for blurred vision, double vision and photophobia.  Respiratory: Positive for cough (chronic with phlegm). Negative for hemoptysis and shortness of breath.   Cardiovascular: Negative for chest pain, palpitations, orthopnea and leg swelling.  Gastrointestinal: Positive for abdominal pain, constipation, nausea and vomiting (x 1). Negative for blood in stool, diarrhea and melena.       Poor appetite.  Light colored stools.  Stool incontinence x1.  Genitourinary: Negative for dysuria, frequency, hematuria and urgency.       Decreased urine output.  Urine dark in color.  Musculoskeletal: Positive for back pain (s/p steroid injection for sciatica during summer; no pain since). Negative for falls and  joint pain.  Skin: Negative.  Negative for itching and rash.  Neurological: Positive for weakness (generalized). Negative for dizziness, tingling, tremors, sensory change, speech change, focal weakness and headaches.  Endo/Heme/Allergies: Negative.  Does not bruise/bleed easily.  Psychiatric/Behavioral: Positive for memory loss.       Sleeping during the day.   Performance status (ECOG): 3  Vitals Blood pressure (!) 92/54, pulse 83, temperature 98.5 F (36.9 C), temperature source Oral, resp. rate 20, height 5\' 8"  (1.727 m), weight 156 lb (70.8 kg), SpO2 100 %.   Physical Exam  Constitutional: No distress.  Fatigued appearing gentleman sitting comfortably in a wheelchair in the exam room in no acute distress.  He is examined in the wheelchair.  HENT:  Head: Normocephalic and atraumatic.  Mouth/Throat: Oropharynx is clear and moist. No oropharyngeal exudate.  Eyes: EOM are normal. Scleral icterus is present.  Right eye pinpoint; left eye irregular and non reactive (unsure if baseline s/p stroke).  Neck: Normal range of motion. Neck supple. No JVD present.  Cardiovascular: Normal rate, regular rhythm and normal heart sounds.  No murmur heard. Pulmonary/Chest: Effort normal. No respiratory distress. He has no wheezes. He has no rales.  Abdominal: Soft. Bowel sounds are normal. He exhibits no mass. There is no abdominal tenderness. There is no rebound and no guarding.  Slightly distended.  Liver palpable.  Musculoskeletal: Normal range of motion.        General: No edema.  Lymphadenopathy:       Head (right side): No preauricular, no posterior auricular and no occipital adenopathy present.       Head (left side): No preauricular and no posterior auricular adenopathy present.    He has no cervical adenopathy.    He has no axillary adenopathy.       Right: No inguinal adenopathy present.       Left: No inguinal and no supraclavicular adenopathy present.  Neurological: He is alert.  Coordination abnormal.  Knows month and year.  Knows president.  Makes change with difficulty.  Difficulty counting backwards by 3s.  Remembers 3 words immediately, but after 20 minutes, he can recall one word spontaneously and one word with prompting.  Skin: Skin is warm  and dry. No rash noted. He is not diaphoretic. No erythema. No pallor.  Jaundiced.  Psychiatric: He has a normal mood and affect. His speech is normal. Judgment and thought content normal. He is slowed.  Nursing note and vitals reviewed.   No visits with results within 3 Day(s) from this visit.  Latest known visit with results is:  Orders Only on 01/12/2019  Component Date Value Ref Range Status  . SARS-CoV-2, NAA 01/12/2019 Not Detected  Not Detected Final   Comment: This nucleic acid amplification test was developed and its performance characteristics determined by [employer]. Nucleic acid amplification tests include PCR and TMA. This test has not been FDA cleared or approved. This test has been authorized by FDA under an Emergency Use Authorization (EUA). This test is only authorized for the duration of time the declaration that circumstances exist justifying the authorization of the emergency use of in vitro diagnostic tests for detection of SARS-CoV-2 virus and/or diagnosis of COVID-19 infection under section 564(b)(1) of the Act, 21 U.S.C. GF:7541899) (1), unless the authorization is terminated or revoked sooner. When diagnostic testing is negative, the possibility of a false negative result should be considered in the context of a patient's recent exposures and the presence of clinical signs and symptoms consistent with COVID-19. An individual without symptoms of COVID-19 and who is not shedding SARS-CoV-2 virus would                           expect to have a negative (not detected) result in this assay.     Assessment:  Darryl Carter is a 78 y.o. 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   Abdomen and pelvis CT on 01/16/2019 revealed widespread malignancy with pancreas adenocarcinoma the favored primary.  There was extensive metastatic disease in the liver, upper retroperitoneum, and peritoneum.  There were numerous low-dose low-density lesions in the liver of various sizes up to 6.3 cm.  Disease involved all hepatic segments.   There was no evidence of biliary obstruction or stone.  There was a 12 x 5 cm lobulated mass spanning the length of the pancreatic body and tail.  There was encasement and likely narrowing of the splenic artery and occlusion of the splenic vein with venous collaterals.  There were enlarged lymph nodes in the peripancreatic and porta hepatis regions.  There was generalized omental nodularity with the largest deposits and along the left pericolic gutter masses measured up to 3 cm in size.   Symptomatically, he is weak and unable to stand.  Blood pressure is low.  He has had little to eat or drink.  Weight is down 20 pounds in 5 months.  Performance status is poor.  Exam reveals extreme debilitation.  He is jaundiced.  Plan: 1.   Labs today:  CBC with diff, CMP, ammonia, CA19-9. 2.   Probable metastatic pancreatic cancer  Review CT images with patient and his wife.   Discuss consideration of obtaining tissue for confirmation.  Discuss palliative chemotherapy vs supportive care/Hospice.  Discuss chemotherapy difficult if performance status is poor or patient has poor renal/hepatic function.  Discuss code status.  Patient not interested in aggressive intervention.  Medical power of attorney and living will paperwork provided. 3.   Discuss admission to the hospital secondary to rapid decline in health.  Discuss hydration and improving nutritional status.  Discuss improving reversible issues to see if performance status improves for consideration of biopsy and treatment.  Consult palliative care and Hospice services.  Multiple questions asked and answered.  Patient  and his wife are agreeable.  Addendum:  Sodium 126, BUN 69, creatinine 7.40, alkaline phosphatase 1740, AST/ALT 174/179, and bilirubin 7.7.  I discussed the assessment and treatment plan with the patient.  The patient was provided an opportunity to ask questions and all were answered.  The patient agreed with the plan and demonstrated an understanding of the instructions.  The patient was advised to call back if the symptoms worsen or if the condition fails to improve as anticipated.  I provided > 50 minutes of face-to-face time during this this encounter and > 50% was spent counseling as documented under my assessment and plan.    Melissa C. Mike Gip, MD, PhD    01/19/2019, 11:56 AM  I, Samul Dada, am acting as scribe for [employer]. Mike Gip, MD, PhD.  I, Melissa C. Mike Gip, MD, have reviewed the above documentation for accuracy and completeness, and I agree with the above.

## 2019-01-19 ENCOUNTER — Telehealth: Payer: Self-pay

## 2019-01-19 ENCOUNTER — Encounter: Payer: Self-pay | Admitting: Hematology and Oncology

## 2019-01-19 ENCOUNTER — Inpatient Hospital Stay: Payer: Medicare Other | Attending: Hematology and Oncology | Admitting: Hematology and Oncology

## 2019-01-19 ENCOUNTER — Inpatient Hospital Stay
Admission: AD | Admit: 2019-01-19 | Discharge: 2019-01-21 | DRG: 683 | Disposition: A | Discharge status: Hospice | Payer: Medicare Other | Source: Ambulatory Visit | Attending: Internal Medicine | Admitting: Internal Medicine

## 2019-01-19 ENCOUNTER — Encounter: Payer: Self-pay | Admitting: Internal Medicine

## 2019-01-19 ENCOUNTER — Other Ambulatory Visit: Payer: Self-pay

## 2019-01-19 VITALS — BP 92/54 | HR 83 | Temp 98.5°F | Resp 20 | Ht 68.0 in | Wt 156.0 lb

## 2019-01-19 DIAGNOSIS — Z20828 Contact with and (suspected) exposure to other viral communicable diseases: Secondary | ICD-10-CM | POA: Diagnosis present

## 2019-01-19 DIAGNOSIS — C772 Secondary and unspecified malignant neoplasm of intra-abdominal lymph nodes: Secondary | ICD-10-CM | POA: Diagnosis present

## 2019-01-19 DIAGNOSIS — T171XXA Foreign body in nostril, initial encounter: Secondary | ICD-10-CM | POA: Diagnosis not present

## 2019-01-19 DIAGNOSIS — Z8249 Family history of ischemic heart disease and other diseases of the circulatory system: Secondary | ICD-10-CM | POA: Diagnosis not present

## 2019-01-19 DIAGNOSIS — I959 Hypotension, unspecified: Secondary | ICD-10-CM | POA: Diagnosis present

## 2019-01-19 DIAGNOSIS — C787 Secondary malignant neoplasm of liver and intrahepatic bile duct: Secondary | ICD-10-CM | POA: Diagnosis present

## 2019-01-19 DIAGNOSIS — Z794 Long term (current) use of insulin: Secondary | ICD-10-CM

## 2019-01-19 DIAGNOSIS — E7800 Pure hypercholesterolemia, unspecified: Secondary | ICD-10-CM | POA: Diagnosis present

## 2019-01-19 DIAGNOSIS — Z800 Family history of malignant neoplasm of digestive organs: Secondary | ICD-10-CM

## 2019-01-19 DIAGNOSIS — N401 Enlarged prostate with lower urinary tract symptoms: Secondary | ICD-10-CM | POA: Diagnosis present

## 2019-01-19 DIAGNOSIS — E860 Dehydration: Secondary | ICD-10-CM | POA: Diagnosis present

## 2019-01-19 DIAGNOSIS — I10 Essential (primary) hypertension: Secondary | ICD-10-CM | POA: Diagnosis present

## 2019-01-19 DIAGNOSIS — K8689 Other specified diseases of pancreas: Secondary | ICD-10-CM | POA: Insufficient documentation

## 2019-01-19 DIAGNOSIS — Z888 Allergy status to other drugs, medicaments and biological substances status: Secondary | ICD-10-CM | POA: Diagnosis not present

## 2019-01-19 DIAGNOSIS — Y92239 Unspecified place in hospital as the place of occurrence of the external cause: Secondary | ICD-10-CM | POA: Diagnosis not present

## 2019-01-19 DIAGNOSIS — C786 Secondary malignant neoplasm of retroperitoneum and peritoneum: Secondary | ICD-10-CM | POA: Insufficient documentation

## 2019-01-19 DIAGNOSIS — E871 Hypo-osmolality and hyponatremia: Secondary | ICD-10-CM | POA: Diagnosis present

## 2019-01-19 DIAGNOSIS — Z79899 Other long term (current) drug therapy: Secondary | ICD-10-CM | POA: Diagnosis not present

## 2019-01-19 DIAGNOSIS — R5381 Other malaise: Secondary | ICD-10-CM | POA: Insufficient documentation

## 2019-01-19 DIAGNOSIS — Z7902 Long term (current) use of antithrombotics/antiplatelets: Secondary | ICD-10-CM | POA: Diagnosis not present

## 2019-01-19 DIAGNOSIS — E119 Type 2 diabetes mellitus without complications: Secondary | ICD-10-CM | POA: Diagnosis present

## 2019-01-19 DIAGNOSIS — R627 Adult failure to thrive: Secondary | ICD-10-CM | POA: Diagnosis present

## 2019-01-19 DIAGNOSIS — J342 Deviated nasal septum: Secondary | ICD-10-CM | POA: Diagnosis present

## 2019-01-19 DIAGNOSIS — Y848 Other medical procedures as the cause of abnormal reaction of the patient, or of later complication, without mention of misadventure at the time of the procedure: Secondary | ICD-10-CM | POA: Diagnosis not present

## 2019-01-19 DIAGNOSIS — R338 Other retention of urine: Secondary | ICD-10-CM | POA: Diagnosis present

## 2019-01-19 DIAGNOSIS — Z66 Do not resuscitate: Secondary | ICD-10-CM | POA: Diagnosis not present

## 2019-01-19 DIAGNOSIS — Z833 Family history of diabetes mellitus: Secondary | ICD-10-CM

## 2019-01-19 DIAGNOSIS — Z515 Encounter for palliative care: Secondary | ICD-10-CM | POA: Diagnosis not present

## 2019-01-19 DIAGNOSIS — Z87891 Personal history of nicotine dependence: Secondary | ICD-10-CM | POA: Diagnosis not present

## 2019-01-19 DIAGNOSIS — E785 Hyperlipidemia, unspecified: Secondary | ICD-10-CM | POA: Diagnosis present

## 2019-01-19 DIAGNOSIS — C259 Malignant neoplasm of pancreas, unspecified: Secondary | ICD-10-CM

## 2019-01-19 DIAGNOSIS — N179 Acute kidney failure, unspecified: Principal | ICD-10-CM | POA: Diagnosis present

## 2019-01-19 DIAGNOSIS — Z7189 Other specified counseling: Secondary | ICD-10-CM | POA: Insufficient documentation

## 2019-01-19 DIAGNOSIS — Z8673 Personal history of transient ischemic attack (TIA), and cerebral infarction without residual deficits: Secondary | ICD-10-CM

## 2019-01-19 DIAGNOSIS — R531 Weakness: Secondary | ICD-10-CM | POA: Insufficient documentation

## 2019-01-19 DIAGNOSIS — N289 Disorder of kidney and ureter, unspecified: Secondary | ICD-10-CM | POA: Diagnosis not present

## 2019-01-19 DIAGNOSIS — D630 Anemia in neoplastic disease: Secondary | ICD-10-CM | POA: Diagnosis present

## 2019-01-19 LAB — CBC WITH DIFFERENTIAL/PLATELET
Abs Immature Granulocytes: 0.11 10*3/uL — ABNORMAL HIGH (ref 0.00–0.07)
Basophils Absolute: 0 10*3/uL (ref 0.0–0.1)
Basophils Relative: 0 %
Eosinophils Absolute: 0 10*3/uL (ref 0.0–0.5)
Eosinophils Relative: 0 %
HCT: 28.2 % — ABNORMAL LOW (ref 39.0–52.0)
Hemoglobin: 10 g/dL — ABNORMAL LOW (ref 13.0–17.0)
Immature Granulocytes: 1 %
Lymphocytes Relative: 6 %
Lymphs Abs: 0.8 10*3/uL (ref 0.7–4.0)
MCH: 24.5 pg — ABNORMAL LOW (ref 26.0–34.0)
MCHC: 35.5 g/dL (ref 30.0–36.0)
MCV: 69.1 fL — ABNORMAL LOW (ref 80.0–100.0)
Monocytes Absolute: 0.7 10*3/uL (ref 0.1–1.0)
Monocytes Relative: 5 %
Neutro Abs: 12.9 10*3/uL — ABNORMAL HIGH (ref 1.7–7.7)
Neutrophils Relative %: 88 %
Platelets: 280 10*3/uL (ref 150–400)
RBC: 4.08 MIL/uL — ABNORMAL LOW (ref 4.22–5.81)
RDW: 18.8 % — ABNORMAL HIGH (ref 11.5–15.5)
WBC: 14.6 10*3/uL — ABNORMAL HIGH (ref 4.0–10.5)
nRBC: 0 % (ref 0.0–0.2)

## 2019-01-19 LAB — COMPREHENSIVE METABOLIC PANEL
ALT: 179 U/L — ABNORMAL HIGH (ref 0–44)
AST: 174 U/L — ABNORMAL HIGH (ref 15–41)
Albumin: 2.1 g/dL — ABNORMAL LOW (ref 3.5–5.0)
Alkaline Phosphatase: 1740 U/L — ABNORMAL HIGH (ref 38–126)
Anion gap: 19 — ABNORMAL HIGH (ref 5–15)
BUN: 69 mg/dL — ABNORMAL HIGH (ref 8–23)
CO2: 17 mmol/L — ABNORMAL LOW (ref 22–32)
Calcium: 7.5 mg/dL — ABNORMAL LOW (ref 8.9–10.3)
Chloride: 90 mmol/L — ABNORMAL LOW (ref 98–111)
Creatinine, Ser: 7.4 mg/dL — ABNORMAL HIGH (ref 0.61–1.24)
GFR calc Af Amer: 7 mL/min — ABNORMAL LOW (ref >60)
GFR calc non Af Amer: 6 mL/min — ABNORMAL LOW (ref >60)
Glucose, Bld: 181 mg/dL — ABNORMAL HIGH (ref 70–99)
Potassium: 3.9 mmol/L (ref 3.5–5.1)
Sodium: 126 mmol/L — ABNORMAL LOW (ref 135–145)
Total Bilirubin: 7.7 mg/dL — ABNORMAL HIGH (ref 0.3–1.2)
Total Protein: 6 g/dL — ABNORMAL LOW (ref 6.5–8.1)

## 2019-01-19 LAB — URINALYSIS, COMPLETE (UACMP) WITH MICROSCOPIC
Bilirubin Urine: NEGATIVE
Glucose, UA: NEGATIVE mg/dL
Hgb urine dipstick: NEGATIVE
Ketones, ur: NEGATIVE mg/dL
Leukocytes,Ua: NEGATIVE
Nitrite: NEGATIVE
Protein, ur: 30 mg/dL — AB
Specific Gravity, Urine: 1.017 (ref 1.005–1.030)
pH: 5 (ref 5.0–8.0)

## 2019-01-19 LAB — GLUCOSE, CAPILLARY
Glucose-Capillary: 196 mg/dL — ABNORMAL HIGH (ref 70–99)
Glucose-Capillary: 172 mg/dL — ABNORMAL HIGH (ref 70–99)
Glucose-Capillary: 114 mg/dL — ABNORMAL HIGH (ref 70–99)

## 2019-01-19 LAB — AMMONIA: Ammonia: 19 umol/L (ref 9–35)

## 2019-01-19 MED ORDER — ONDANSETRON HCL 4 MG/2ML IJ SOLN: 4.0000 mg | Freq: Four times a day (QID) | INTRAMUSCULAR | Status: DC | PRN

## 2019-01-19 MED ORDER — ACETAMINOPHEN 325 MG PO TABS: 650.0000 mg | ORAL_TABLET | Freq: Four times a day (QID) | ORAL | Status: DC | PRN

## 2019-01-19 MED ORDER — FINASTERIDE 5 MG PO TABS
5.0000 mg | ORAL_TABLET | Freq: Every day | ORAL | Status: DC
  Administered 2019-01-20 – 2019-01-21 (×2): 5 mg via ORAL
  Filled 2019-01-19 (×2): qty 1

## 2019-01-19 MED ORDER — OXYMETAZOLINE HCL 0.05 % NA SOLN
1.0000 | Freq: Once | NASAL | Status: AC
  Administered 2019-01-19: 1 via NASAL
  Filled 2019-01-19: qty 15

## 2019-01-19 MED ORDER — ACETAMINOPHEN 650 MG RE SUPP: 650.0000 mg | Freq: Four times a day (QID) | RECTAL | Status: DC | PRN

## 2019-01-19 MED ORDER — INSULIN ASPART 100 UNIT/ML ~~LOC~~ SOLN: 0.0000 [IU] | Freq: Every day | SUBCUTANEOUS | Status: DC

## 2019-01-19 MED ORDER — MEMANTINE HCL 5 MG PO TABS
5.0000 mg | ORAL_TABLET | Freq: Two times a day (BID) | ORAL | Status: DC
  Administered 2019-01-19 – 2019-01-21 (×3): 5 mg via ORAL
  Filled 2019-01-19 (×3): qty 1

## 2019-01-19 MED ORDER — SODIUM CHLORIDE 0.9 % IV SOLN
INTRAVENOUS | Status: DC
  Administered 2019-01-19 – 2019-01-21 (×5): via INTRAVENOUS

## 2019-01-19 MED ORDER — LIDOCAINE VISCOUS HCL 2 % MT SOLN
15.0000 mL | Freq: Once | OROMUCOSAL | Status: AC
  Administered 2019-01-19: 15 mL via OROMUCOSAL
  Filled 2019-01-19: qty 15

## 2019-01-19 MED ORDER — INSULIN ASPART 100 UNIT/ML ~~LOC~~ SOLN
0.0000 [IU] | Freq: Three times a day (TID) | SUBCUTANEOUS | Status: DC
  Administered 2019-01-19: 2 [IU] via SUBCUTANEOUS
  Filled 2019-01-19: qty 1

## 2019-01-19 MED ORDER — SODIUM CHLORIDE 0.9 % IV BOLUS
500.0000 mL | Freq: Once | INTRAVENOUS | Status: AC
  Administered 2019-01-19: 16:00:00 500 mL via INTRAVENOUS

## 2019-01-19 MED ORDER — TAMSULOSIN HCL 0.4 MG PO CAPS
0.4000 mg | ORAL_CAPSULE | Freq: Every day | ORAL | Status: DC
  Administered 2019-01-20 – 2019-01-21 (×2): 0.4 mg via ORAL
  Filled 2019-01-19 (×2): qty 1

## 2019-01-19 NOTE — Consult Note (Signed)
Miranda, Mcel[place]inny [id number] 23-Apr-1940 Loletha Grayer, MD  Reason for Consult: Foreign body in nose  HPI: 78 y.o. male being admitted for acute kidney injury.  During COVID testing, a piece of the swab broke off into patient's left nasal cavity.  Attempts at seeing swab piece failed and I was asked to evaluate for possible foreign body.  Allergies:  Allergies  Allergen Reactions  . Donepezil Diarrhea    ROS: Review of systems normal other than 12 systems except per HPI.  PMH:  Past Medical History:  Diagnosis Date  . Diabetes mellitus without complication (HCC)    INSULIN  . Dysrhythmia   . Heart murmur   . Hypercholesterolemia   . Hypertension    CONTROLLED ON MEDS  . Renal insufficiency   . Stroke [facility])    CVA '05  . Wears dentures    UPPER AND LOWER    FH:  Family History  Problem Relation Age of Onset  . Hypertension Mother   . Diabetes Mother   . Hypertension Father   . Diabetes Father   . Hypertension Sister   . Diabetes Sister   . Colon cancer Sister   . Hypertension Brother   . Diabetes Brother     SH:  Social History   Socioeconomic History  . Marital status: Married    Spouse name: Not on file  . Number of children: Not on file  . Years of education: Not on file  . Highest education level: Not on file  Occupational History  . Not on file  Social Needs  . Financial resource strain: Not on file  . Food insecurity    Worry: Not on file    Inability: Not on file  . Transportation needs    Medical: Not on file    Non-medical: Not on file  Tobacco Use  . Smoking status: Former Smoker    Packs/day: 2.00    Years: 34.00    Pack years: 68.00    Types: Cigarettes    Quit date: 04/24/2001    Years since quitting: 17.7  . Smokeless tobacco: Never Used  Substance and Sexual Activity  . Alcohol use: No    Alcohol/week: 0.0 standard drinks    Comment: Quit in 2003  . Drug use: No  . Sexual activity: Not on file  Lifestyle  . Physical activity     Days per week: Not on file    Minutes per session: Not on file  . Stress: Not on file  Relationships  . Social Herbalist on phone: Not on file    Gets together: Not on file    Attends religious service: Not on file    Active member of club or organization: Not on file    Attends meetings of clubs or organizations: Not on file    Relationship status: Not on file  . Intimate partner violence    Fear of current or ex partner: Not on file    Emotionally abused: Not on file    Physically abused: Not on file    Forced sexual activity: Not on file  Other Topics Concern  . Not on file  Social History Narrative  . Not on file    PSH:  Past Surgical History:  Procedure Laterality Date  . CATARACT EXTRACTION Left 2010  . CATARACT EXTRACTION W/PHACO Right 07/14/2016   Procedure: CATARACT EXTRACTION PHACO AND INTRAOCULAR LENS PLACEMENT ([place]) Diabetic Right;  Surgeon: Leandrew Koyanagi, MD;  [facility];  Service: Ophthalmology;  Laterality: Right;  DIABETES-INSULIN DEPENDENT  . COLONOSCOPY    . COLONOSCOPY WITH PROPOFOL N/A 03/30/2017   Procedure: COLONOSCOPY WITH PROPOFOL;  Surgeon: Toledo, [name], MD;  Location: [facility] ENDOSCOPY;  Service: Gastroenterology;  Laterality: N/A;    Physical  Exam:  GEN-  NAD, laying supine in bed EARS-  External ears clear bilaterally NOSE- blood anteriorly bilaterally, crusting on left, significant left sided septal deviation inferiorly along entire course of nasal cavity.  Blood medial to left middle turbinate OC/OP-  Clear with no masses NECK-  Supple CARD-  RRR RESP- unlabored  Procedure:  Nasal endoscopy with removal of foreign body-  After verbal consent obtained Afrin and 2% lidocaine was sprayed into bilateral nostrils.  A flexible fiberoptic scope was used for nasal endoscopy.  This demonstrated no abnormality on right with normal naosopharynx.  On left, Left sided septal deviation was noted along with blood medial  to the middle turbinate.  The nasopharynx was clear.  A foreign body was seen beneath the blood wedged partially into the frontal recess.  Under direct visualization, a 45 degree upbiting forcep was used to gently grasp the plastic edge of the swab and gently remove from the nasal cavity without further injury.  Hemostasis was achieved.   A/P: Foreign Body of left nasal cavity  Plan:  Cleared for return to normal activity.  Afrin PRN epistaxis if needed.  COVID swab performed on patient's right side.   Jeannie Fend Dustine Stickler 01/19/2019 7:29 PM

## 2019-01-19 NOTE — Progress Notes (Signed)
Patient here as new patient referral by Dr. Raechel Ache. Reports he has no appetite x 6 months. Also sleeping more during the day which is causing issues with sleeping at night. Patient is c/o losing his voice x 2-3 months.

## 2019-01-19 NOTE — Telephone Encounter (Signed)
Direct Admit report given to Harrell Gave, RN on 1C. Patient is to be admitted to room 119. Harrell Gave, RN denies any further questions. Patient and wife informed of location, nurse, MD, etc.

## 2019-01-19 NOTE — H&P (Addendum)
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  HISTORY OF PRESENT ILLNESS:  Darryl Carter  is a 78 y.o. male with a known history of hypertension diabetes hyperlipidemia presents with fatigue, weight loss, not eating very well.  His wife states he has been disoriented.  He has been sleeping a lot and been in the bed this past week.  He recently had a CT scan of the abdomen and pelvis which showed widespread malignancy with a large pancreatic mass, liver lesions, upper retroperitoneum and peritoneum lesions.  Patient had laboratory data as outpatient and creatinine was elevated at 7.4.  Patient sent in for direct admission.  PAST MEDICAL HISTORY:   Past Medical History:  Diagnosis Date  . Diabetes mellitus without complication (HCC)    INSULIN  . Dysrhythmia   . Heart murmur   . Hypercholesterolemia   . Hypertension    CONTROLLED ON MEDS  . Renal insufficiency   . Stroke [facility])    CVA '05  . Wears dentures    UPPER AND LOWER    PAST SURGICAL HISTORY:   Past Surgical History:  Procedure Laterality Date  . CATARACT EXTRACTION Left 2010  . CATARACT EXTRACTION W/PHACO Right 07/14/2016   Procedure: CATARACT EXTRACTION PHACO AND INTRAOCULAR LENS PLACEMENT ([place]) Diabetic Right;  Surgeon: Leandrew Koyanagi, MD;  Location: [facility];  Service: Ophthalmology;  Laterality: Right;  DIABETES-INSULIN DEPENDENT  . COLONOSCOPY    . COLONOSCOPY WITH PROPOFOL N/A 03/30/2017   Procedure: COLONOSCOPY WITH PROPOFOL;  Surgeon: Toledo, [name], MD;  Location: [facility] ENDOSCOPY;  Service: Gastroenterology;  Laterality: N/A;    SOCIAL HISTORY:   Social History    Tobacco Use  . Smoking status: Former Smoker    Packs/day: 2.00    Years: 34.00    Pack years: 68.00    Types: Cigarettes    Quit date: 04/24/2001    Years since quitting: 17.7  . Smokeless tobacco: Never Used  Substance Use Topics  . Alcohol use: No    Alcohol/week: 0.0 standard drinks    Comment: Quit in 2003    FAMILY HISTORY:   Family History  Problem Relation Age of Onset  . Hypertension Mother   . Diabetes Mother   . Hypertension Father   . Diabetes Father   . Hypertension Sister   . Diabetes Sister   . Colon cancer Sister   . Hypertension Brother   . Diabetes Brother     DRUG ALLERGIES:   Allergies  Allergen Reactions  . Donepezil Diarrhea    REVIEW OF SYSTEMS:  CONSTITUTIONAL: No fever, chills or sweats.  Positive for 20 pound weight loss.  Positive for fatigue.  EYES: No blurred or double vision.  EARS, NOSE, AND THROAT: No tinnitus or ear pain.  Some sore throat.  Positive for runny nose RESPIRATORY: No cough, shortness of breath, wheezing or hemoptysis.  CARDIOVASCULAR: No chest pain, orthopnea, edema.  GASTROINTESTINAL: Some nausea, vomiting, and abdominal pain. No blood in bowel movements.  Some constipation. GENITOURINARY: No dysuria.  Dark urine ENDOCRINE: No polyuria, nocturia,  HEMATOLOGY: No anemia, easy bruising or bleeding SKIN: No rash or lesion. MUSCULOSKELETAL: No joint pain or arthritis.  NEUROLOGIC: No tingling, numbness, weakness.  PSYCHIATRY: No anxiety or depression.   MEDICATIONS AT HOME:   Prior to Admission medications   Medication Sig Start Date End Date Taking? Authorizing Provider  amLODipine (NORVASC) 10 MG tablet Take 1 tablet (10 mg total) by mouth daily. Patient taking differently: Take 10 mg by mouth daily. PM 04/18/15   Roselee Nova, MD  atorvastatin (LIPITOR) 80 MG tablet Take 1 tablet (80 mg total) by mouth daily at 6 PM. 04/18/15   Roselee Nova, MD  clopidogrel (PLAVIX) 75 MG tablet Take 1 tablet (75 mg  total) by mouth daily. Patient taking differently: Take 75 mg by mouth daily. PM 10/10/14   Roselee Nova, MD  enalapril (VASOTEC) 20 MG tablet Take 1 tablet (20 mg total) by mouth 2 (two) times daily. Patient taking differently: Take 20 mg by mouth 2 (two) times daily. MORNING AND NIGHT 04/18/15   Roselee Nova, MD  finasteride (PROSCAR) 5 MG tablet Take 1 tablet (5 mg total) by mouth daily. 03/03/18   Hollice Espy, MD  hydrochlorothiazide (HYDRODIURIL) 25 MG tablet Take 1 tablet (25 mg total) by mouth daily. Patient taking differently: Take 25 mg by mouth daily. PM 04/18/15   Roselee Nova, MD  insulin NPH-regular Human (70-30) 100 UNIT/ML injection Inject 20 Units into the skin 2 (two) times daily with a meal.  09/30/17   [provider]  Insulin Syringe-Needle U-100 31G X 15/64" 1 ML MISC  09/03/14   [provider]  memantine (NAMENDA) 10 MG tablet Take 10 mg by mouth 2 (two) times daily. 11/03/17   [provider]  metFORMIN (GLUCOPHAGE) 1000 MG tablet Take 1 tablet by mouth daily with breakfast. BEFORE BREAKFAST AND BEDTIME 08/27/14   [provider]  metoprolol (LOPRESSOR) 50 MG tablet Take 1 tablet (50 mg total) by mouth 2 (two) times daily. 04/18/15   Roselee Nova, MD  ondansetron (ZOFRAN-ODT) 4 MG disintegrating tablet Take 4 mg by mouth every 8 (eight) hours as needed.  01/08/19   [provider]  tamsulosin (FLOMAX) 0.4 MG CAPS capsule Take 0.4 mg by mouth daily.  08/06/18   [provider]  vitamin C (ASCORBIC ACID) 500 MG tablet Take 500 mg by mouth daily. AM    [provider]      VITAL SIGNS:  Blood pressure (!) 99/58, pulse 87, temperature 98.4 F (36.9 C), temperature source Oral, resp. rate 16, SpO2 100 %.  PHYSICAL EXAMINATION:  GENERAL:  78 y.o.-year-old patient lying in the bed with no acute distress.  EYES: Pupils equal, round, reactive to light and accommodation.  Positive scleral icterus.  Extraocular muscles intact.  HEENT: Head atraumatic, normocephalic. Oropharynx and nasopharynx clear.  NECK:  Supple, no jugular venous distention. No thyroid enlargement, no tenderness.  LUNGS: Normal breath sounds bilaterally, no wheezing, rales,rhonchi or crepitation. No use of accessory muscles of respiration.  CARDIOVASCULAR: S1, S2 normal. No murmurs, rubs, or gallops.  ABDOMEN: Soft, nontender, nondistended. Bowel sounds present. No organomegaly or mass.  EXTREMITIES: No pedal edema, cyanosis, or clubbing.  NEUROLOGIC: Cranial nerves II through XII are intact. Muscle strength 5/5 in all extremities. Sensation intact. Gait not checked.  PSYCHIATRIC: The patient is alert and oriented x 3.  SKIN: No rash, lesion, or ulcer.  Positive for jaundice  LABORATORY PANEL:   CBC Recent Labs  Lab 01/19/19 1248  WBC 14.6*  HGB 10.0*  HCT 28.2*  PLT  280   ------------------------------------------------------------------------------------------------------------------  Chemistries  Recent Labs  Lab 01/19/19 1248  NA 126*  K 3.9  CL 90*  CO2 17*  GLUCOSE 181*  BUN 69*  CREATININE 7.40*  CALCIUM 7.5*  AST 174*  ALT 179*  ALKPHOS 1,740*  BILITOT 7.7*   ------------------------------------------------------------------------------------------------------------------   IMPRESSION AND PLAN:   1.  Acute kidney injury hopefully all from dehydration.  Will check a bladder scan to see if he is retaining urine if he is then will place a Foley catheter.  IV fluid bolus and hydration.  I will hold all antihypertensive medications and medications that can affect kidney function (Norvasc, Vasotec, hydrochlorothiazide, Glucophage, metoprolol).  Patient does not want dialysis if his kidney function does not improve. 2.  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  Elevated liver function test and bilirubin.  Will hold Plavix just in case oncology wants to do a biopsy.  Palliative  care consultation.  Patient is a DO NOT RESUSCITATE. 3.  BPH.  Continue Flomax and finasteride.  Check a bladder scan to see if he is retaining urine 4.  Relative hypotension.  Hold all antihypertensive medications including Norvasc, Vasotec, hydrochlorothiazide and metoprolol.  IV fluid hydration. 5.  Type 2 diabetes mellitus.  Hold insulin and Glucophage and put on sliding scale at this point. 6.  History of stroke.  Hold Plavix just in case oncology wants to do a biopsy.  All the recent laboratory and recent radiological data reviewed and case discussed with oncology. Management plans discussed with the patient, family and they are in agreement.  CODE STATUS: DNR  TOTAL TIME TAKING CARE OF THIS PATIENT: 50 minutes, including ACP time.    Loletha Grayer M.D on 01/19/2019 at 3:01 PM  Between 7am to 6pm - Pager - [phone]  After 6pm call admission pager [phone]  [facility]  [phone]  CC: Primary care physician; Ezequiel Kayser, MD

## 2019-01-19 NOTE — Progress Notes (Signed)
Contacted Dr Leslye Peer and he came to the patients room. While I swabbed the patient for covid the end of the swab broke off in nasal passage.  Dr  Earleen Newport was unable to detect end of swab in nose and he contacted ENT Dr Pryor Ochoa who is going to come by to see patient.  Patients wife was outside room and notified as was charge Interior and spatial designer for [employer]

## 2019-01-19 NOTE — Progress Notes (Signed)
Patient ID: Darryl Carter, male   DOB: [birth year], 78 y.o.   MRN: [id number]  Nursing staff was doing a Covid swab in the end of the Covid swab broke off in the patient's nose and was unable to be seen.  I came to evaluate the patient took the otoscope and tried to look in his nose and I did not see any swab in there.  I did pull out a small blood clot.  I spoke with ENT Dr. Pryor Ochoa who will come and evaluate the patient and see if he finds any part of the swab in his nose.  Dr Loletha Grayer

## 2019-01-19 NOTE — Progress Notes (Signed)
Patient ID: Darryl Carter, male   DOB: 1940/05/14, 78 y.o.   MRN: [id number]  ACP note  Patient and wife present at the bedside  Patient coming in with weakness and lying in the bed for a week.  Not eating and drinking very much.  Disorientation weight loss and fatigue.  Patient had a recent CAT scan that showed a large pancreatic mass with mets to the retroperitoneum and liver.  Patient was seen in the oncology office today and sent in for direct admission for acute kidney injury.  CODE STATUS discussed and patient wishes to be a DO NOT RESUSCITATE.  Plan.  For the patient's acute kidney injury, we will give IV fluid hydration and check a bladder scan.  For the patient's metastatic carcinomatosis, his overall prognosis is poor.  He was made a DO NOT RESUSCITATE.  Hold Plavix just in case biopsy.  For his hypotension we will give IV fluids.  Time spent on ACP discussion 17 minutes Dr Loletha Grayer

## 2019-01-20 ENCOUNTER — Inpatient Hospital Stay: Payer: Medicare Other

## 2019-01-20 DIAGNOSIS — E871 Hypo-osmolality and hyponatremia: Secondary | ICD-10-CM

## 2019-01-20 DIAGNOSIS — E119 Type 2 diabetes mellitus without complications: Secondary | ICD-10-CM

## 2019-01-20 DIAGNOSIS — I10 Essential (primary) hypertension: Secondary | ICD-10-CM | POA: Diagnosis not present

## 2019-01-20 DIAGNOSIS — C259 Malignant neoplasm of pancreas, unspecified: Secondary | ICD-10-CM

## 2019-01-20 DIAGNOSIS — R627 Adult failure to thrive: Secondary | ICD-10-CM | POA: Diagnosis not present

## 2019-01-20 DIAGNOSIS — N289 Disorder of kidney and ureter, unspecified: Secondary | ICD-10-CM

## 2019-01-20 LAB — CANCER ANTIGEN 19-9: CA 19-9: 5656 U/mL — ABNORMAL HIGH (ref 0–35)

## 2019-01-20 LAB — GLUCOSE, CAPILLARY
Glucose-Capillary: 94 mg/dL (ref 70–99)
Glucose-Capillary: 107 mg/dL — ABNORMAL HIGH (ref 70–99)
Glucose-Capillary: 104 mg/dL — ABNORMAL HIGH (ref 70–99)
Glucose-Capillary: 111 mg/dL — ABNORMAL HIGH (ref 70–99)

## 2019-01-20 LAB — CBC
HCT: 25.3 % — ABNORMAL LOW (ref 39.0–52.0)
Hemoglobin: 9 g/dL — ABNORMAL LOW (ref 13.0–17.0)
MCH: 24.3 pg — ABNORMAL LOW (ref 26.0–34.0)
MCHC: 35.6 g/dL (ref 30.0–36.0)
MCV: 68.4 fL — ABNORMAL LOW (ref 80.0–100.0)
Platelets: 231 10*3/uL (ref 150–400)
RBC: 3.7 MIL/uL — ABNORMAL LOW (ref 4.22–5.81)
RDW: 19.4 % — ABNORMAL HIGH (ref 11.5–15.5)
WBC: 13 10*3/uL — ABNORMAL HIGH (ref 4.0–10.5)
nRBC: 0 % (ref 0.0–0.2)

## 2019-01-20 LAB — BASIC METABOLIC PANEL
Anion gap: 17 — ABNORMAL HIGH (ref 5–15)
BUN: 78 mg/dL — ABNORMAL HIGH (ref 8–23)
CO2: 19 mmol/L — ABNORMAL LOW (ref 22–32)
Calcium: 7.1 mg/dL — ABNORMAL LOW (ref 8.9–10.3)
Chloride: 93 mmol/L — ABNORMAL LOW (ref 98–111)
Creatinine, Ser: 7.95 mg/dL — ABNORMAL HIGH (ref 0.61–1.24)
GFR calc Af Amer: 7 mL/min — ABNORMAL LOW (ref >60)
GFR calc non Af Amer: 6 mL/min — ABNORMAL LOW (ref >60)
Glucose, Bld: 96 mg/dL (ref 70–99)
Potassium: 4.1 mmol/L (ref 3.5–5.1)
Sodium: 129 mmol/L — ABNORMAL LOW (ref 135–145)

## 2019-01-20 LAB — HEMOGLOBIN A1C
Hgb A1c MFr Bld: 7.7 % — ABNORMAL HIGH (ref 4.8–5.6)
Mean Plasma Glucose: 174.29 mg/dL

## 2019-01-20 LAB — SARS CORONAVIRUS 2 (TAT 6-24 HRS): SARS Coronavirus 2: NEGATIVE

## 2019-01-20 MED ORDER — CHLORHEXIDINE GLUCONATE CLOTH 2 % EX PADS
6.0000 | MEDICATED_PAD | Freq: Every day | CUTANEOUS | Status: DC
  Administered 2019-01-21: 09:00:00 6 via TOPICAL

## 2019-01-20 MED ORDER — POLYETHYLENE GLYCOL 3350 17 G PO PACK
17.0000 g | PACK | Freq: Every day | ORAL | Status: DC
  Administered 2019-01-20 – 2019-01-21 (×2): 17 g via ORAL
  Filled 2019-01-20 (×2): qty 1

## 2019-01-20 NOTE — Progress Notes (Signed)
Physical Therapy Evaluation Patient Details Name: Darryl Carter MRN: [id number] DOB: 1940/07/10 Today's Date: 01/20/2019   History of Present Illness  Darryl Carter  is a 78 y.o. male with a known history of hypertension diabetes hyperlipidemia presents with fatigue, weight loss, not eating very well.  His wife states he has been disoriented.  He has been sleeping a lot and been in the bed this past week.  He recently had a CT scan of the abdomen and pelvis which showed widespread malignancy with a large pancreatic mass, liver lesions, upper retroperitoneum and peritoneum lesions.  Patient had laboratory data as outpatient and creatinine was elevated at 7.4.  Patient sent in for direct admission.  Clinical Impression with Patient is pleasant 78 y.o. male. He has BLE weakness -3/5, needs mod assist for bed mobility supine<>sit, and min assist for sit to stand transfers with RW. He ambulated previously with spc at home. He is able to stand with RW  for 2 mins and then is fatigued and needs to sit back down. He will continue to benefit from skilled PT to improve mobility and strength.   Follow Up Recommendations SNF    Equipment Recommendations  Rolling walker with 5" wheels    Recommendations for Other Services       Precautions / Restrictions Precautions Precautions: None Restrictions Weight Bearing Restrictions: No      Mobility  Bed Mobility Overal bed mobility: Needs Assistance Bed Mobility: Rolling;Supine to Sit;Sit to Supine Rolling: Modified independent (Device/Increase time)   Supine to sit: Max assist Sit to supine: Mod assist   General bed mobility comments: (needs head of bed elevated)  Transfers Overall transfer level: Needs assistance Equipment used: Rolling walker (2 wheeled) Transfers: Sit to/from Stand Sit to Stand: Min assist         General transfer comment: (VC for sequencing)  Ambulation/Gait Ambulation/Gait assistance: (too weak today)               Stairs            Wheelchair Mobility    Modified Rankin (Stroke Patients Only)       Balance Overall balance assessment: Mild deficits observed, not formally tested                                           Pertinent Vitals/Pain Pain Assessment: No/denies pain    Home Living Family/patient expects to be discharged to:: Private residence Living Arrangements: Spouse/significant other Available Help at Discharge: Family Type of Home: Apartment Home Access: Level entry              Prior Function Level of Independence: Independent with assistive device(s)               Hand Dominance        Extremity/Trunk Assessment   Upper Extremity Assessment Upper Extremity Assessment: Generalized weakness    Lower Extremity Assessment Lower Extremity Assessment: Generalized weakness       Communication   Communication: No difficulties  Cognition Arousal/Alertness: Awake/alert Behavior During Therapy: WFL for tasks assessed/performed Overall Cognitive Status: Within Functional Limits for tasks assessed                                        General Comments  Exercises     Assessment/Plan    PT Assessment Patient needs continued PT services  PT Problem List Decreased strength;Decreased activity tolerance;Decreased balance;Decreased mobility;Decreased knowledge of use of DME;Decreased safety awareness       PT Treatment Interventions Gait training;Therapeutic activities;Therapeutic exercise;Balance training    PT Goals (Current goals can be found in the Care Plan section)  Acute Rehab PT Goals Patient Stated Goal: to get stronger PT Goal Formulation: Patient unable to participate in goal setting Time For Goal Achievement: 02/02/19    Frequency Min 2X/week   Barriers to discharge        Co-evaluation               AM-PAC PT "6 Clicks" Mobility  Outcome Measure Help needed turning from your  back to your side while in a flat bed without using bedrails?: A Little Help needed moving from lying on your back to sitting on the side of a flat bed without using bedrails?: Total Help needed moving to and from a bed to a chair (including a wheelchair)?: A Lot Help needed standing up from a chair using your arms (e.g., wheelchair or bedside chair)?: A Lot Help needed to walk in hospital room?: Total Help needed climbing 3-5 steps with a railing? : Total 6 Click Score: 10    End of Session Equipment Utilized During Treatment: Gait belt Activity Tolerance: Patient limited by fatigue Patient left: in bed;with call bell/phone within reach   PT Visit Diagnosis: Unsteadiness on feet (R26.81);Other abnormalities of gait and mobility (R26.89);Muscle weakness (generalized) (M62.81);Difficulty in walking, not elsewhere classified (R26.2)    Time: 1245-1300 PT Time Calculation (min) (ACUTE ONLY): 15 min   Charges:   PT Evaluation $PT Eval Low Complexity: 1 Low            Bracey, Sherryl Barters, PT DPT 01/20/2019, 3:04 PM

## 2019-01-20 NOTE — Plan of Care (Signed)

## 2019-01-20 NOTE — Consult Note (Signed)
[place]  [phone] [phone]  ID: Darryl Carter OB: 1941-03-05  MR#: [id number]  [id number]  Patient Care Team: Ezequiel Kayser, MD as PCP - General (Internal Medicine)  CHIEF COMPLAINT: Probable pancreatic cancer, failure to thrive.  INTERVAL HISTORY: Patient is a 78 year old male who recently had a CT scan which showed widespread lesions and a pancreatic mass highly suspicious for underlying pancreatic cancer.  He has become progressively weak and fatigue over the past several weeks with a declining performance status.  He has a poor appetite and weight loss.  He denies any pain.  He has no neurologic complaints.  He denies any recent fevers or illnesses.  He has no chest pain, shortness of breath, cough, or hemoptysis.  He denies any nausea, vomiting, constipation, or diarrhea.  He has no urinary complaints.  Patient offers no further specific complaints today.  REVIEW OF SYSTEMS:   Review of Systems  Constitutional: Positive for malaise/fatigue and weight loss. Negative for fever.  Respiratory: Negative.  Negative for cough, hemoptysis and shortness of breath.   Cardiovascular: Negative.  Negative for chest pain and leg swelling.  Gastrointestinal: Negative.  Negative for abdominal pain, blood in stool, melena, nausea and vomiting.  Genitourinary: Negative.  Negative for dysuria.  Musculoskeletal: Negative.  Negative for back pain.  Skin: Negative.  Negative for rash.  Neurological: Positive for weakness. Negative for dizziness, focal weakness and headaches.  Psychiatric/Behavioral: Negative.  The patient is not nervous/anxious.     As per HPI. Otherwise, a complete review of systems is negative.  PAST MEDICAL HISTORY: Past Medical History:  Diagnosis Date   Diabetes mellitus without complication ([place])    INSULIN   Dysrhythmia    Heart murmur    Hypercholesterolemia    Hypertension    CONTROLLED ON MEDS   Renal insufficiency     Stroke [facility])    CVA '05   Wears dentures    UPPER AND LOWER    PAST SURGICAL HISTORY: Past Surgical History:  Procedure Laterality Date   CATARACT EXTRACTION Left 2010   CATARACT EXTRACTION W/PHACO Right 07/14/2016   Procedure: CATARACT EXTRACTION PHACO AND INTRAOCULAR LENS PLACEMENT ([place]) Diabetic Right;  Surgeon: Leandrew Koyanagi, MD;  Location: [place];  Service: Ophthalmology;  Laterality: Right;  DIABETES-INSULIN DEPENDENT   COLONOSCOPY     COLONOSCOPY WITH PROPOFOL N/A 03/30/2017   Procedure: COLONOSCOPY WITH PROPOFOL;  Surgeon: Toledo, [name], MD;  Location: [facility] ENDOSCOPY;  Service: Gastroenterology;  Laterality: N/A;    FAMILY HISTORY: Family History  Problem Relation Age of Onset   Hypertension Mother    Diabetes Mother    Hypertension Father    Diabetes Father    Hypertension Sister    Diabetes Sister    Colon cancer Sister    Hypertension Brother    Diabetes Brother     ADVANCED DIRECTIVES (Y/N):  @ADVDIR @  HEALTH MAINTENANCE: Social History   Tobacco Use   Smoking status: Former Smoker    Packs/day: 2.00    Years: 34.00    Pack years: 68.00    Types: Cigarettes    Quit date: 04/24/2001    Years since quitting: 17.7   Smokeless tobacco: Never Used  Substance Use Topics   Alcohol use: No    Alcohol/week: 0.0 standard drinks    Comment: Quit in 2003   Drug use: No     Colonoscopy:  PAP:  Bone density:  Lipid panel:  Allergies  Allergen Reactions   Donepezil  Diarrhea    Current Facility-Administered Medications  Medication Dose Route Frequency Provider Last Rate Last Dose   0.9 %  sodium chloride infusion   Intravenous Continuous Max Sane, MD 100 mL/hr at 01/20/19 1500     acetaminophen (TYLENOL) tablet 650 mg  650 mg Oral Q6H PRN Loletha Grayer, MD       Or   acetaminophen (TYLENOL) suppository 650 mg  650 mg Rectal Q6H PRN Loletha Grayer, MD       Chlorhexidine Gluconate Cloth 2 % PADS 6  each  6 each Topical Daily Max Sane, MD       finasteride (PROSCAR) tablet 5 mg  5 mg Oral Daily Loletha Grayer, MD   5 mg at 01/20/19 1019   insulin aspart (novoLOG) injection 0-5 Units  0-5 Units Subcutaneous QHS Wieting, Richard, MD       insulin aspart (novoLOG) injection 0-9 Units  0-9 Units Subcutaneous TID WC Loletha Grayer, MD   2 Units at 01/19/19 1753   memantine (NAMENDA) tablet 5 mg  5 mg Oral BID Loletha Grayer, MD   5 mg at 01/20/19 1019   ondansetron (ZOFRAN) injection 4 mg  4 mg Intravenous Q6H PRN Loletha Grayer, MD       polyethylene glycol (MIRALAX / GLYCOLAX) packet 17 g  17 g Oral Daily Murlean Iba, MD   17 g at 01/20/19 1154   tamsulosin (FLOMAX) capsule 0.4 mg  0.4 mg Oral Daily Loletha Grayer, MD   0.4 mg at 01/20/19 1019    OBJECTIVE: Vitals:   01/20/19 0502 01/20/19 1017  BP: 122/74 124/64  Pulse: 79 87  Resp: 16 16  Temp: 98 F (36.7 C) 98.4 F (36.9 C)  SpO2: 99% 100%     There is no height or weight on file to calculate BMI.    ECOG FS:3 - Symptomatic, >50% confined to bed  General: Thin, no acute distress. Eyes: Pink conjunctiva, anicteric sclera. HEENT: Normocephalic, moist mucous membranes, clear oropharnyx. Lungs: Clear to auscultation bilaterally. Heart: Regular rate and rhythm. No rubs, murmurs, or gallops. Abdomen: Soft, nontender, nondistended. No organomegaly noted, normoactive bowel sounds. Musculoskeletal: No edema, cyanosis, or clubbing. Neuro: Alert, answering all questions appropriately. Cranial nerves grossly intact. Skin: No rashes or petechiae noted. Psych: Normal affect. Lymphatics: No cervical, calvicular, axillary or inguinal LAD.   LAB RESULTS:  Lab Results  Component Value Date   NA 129 (L) 01/20/2019   K 4.1 01/20/2019   CL 93 (L) 01/20/2019   CO2 19 (L) 01/20/2019   GLUCOSE 96 01/20/2019   BUN 78 (H) 01/20/2019   CREATININE 7.95 (H) 01/20/2019   CALCIUM 7.1 (L) 01/20/2019   PROT 6.0 (L)  01/19/2019   ALBUMIN 2.1 (L) 01/19/2019   AST 174 (H) 01/19/2019   ALT 179 (H) 01/19/2019   ALKPHOS 1,740 (H) 01/19/2019   BILITOT 7.7 (H) 01/19/2019   GFRNONAA 6 (L) 01/20/2019   GFRAA 7 (L) 01/20/2019    Lab Results  Component Value Date   WBC 13.0 (H) 01/20/2019   NEUTROABS 12.9 (H) 01/19/2019   HGB 9.0 (L) 01/20/2019   HCT 25.3 (L) 01/20/2019   MCV 68.4 (L) 01/20/2019   PLT 231 01/20/2019     STUDIES: Ct Abdomen Pelvis W Contrast  Result Date: 01/16/2019 CLINICAL DATA:  20 pound weight loss for 3 months with generalized abdominal pain and nausea EXAM: CT ABDOMEN AND PELVIS WITH CONTRAST TECHNIQUE: Multidetector CT imaging of the abdomen and pelvis was performed using the standard  protocol following bolus administration of intravenous contrast. CONTRAST:  67mL OMNIPAQUE IOHEXOL 300 MG/ML  SOLN COMPARISON:  None. FINDINGS: Lower chest:  Multifocal coronary calcification.  Clear lung bases. Hepatobiliary: Numerous low-density but solid liver masses of various size, up to 6.3 cm in the inferior right liver. These are seen throughout all hepatic segments.No evidence of biliary obstruction or stone. Pancreas: Lobulated mass spanning the length of the body and tail, 12 cm x 5 cm, extending beyond the expected margins the pancreas. Although low-density this has a solid appearance. There is encasement and likely narrowing of the splenic artery and occlusion of the splenic vein with venous collaterals. Spleen: Poor enhancement likely from chronic venous occlusion. Subtle superimposed rounded low densities likely from metastatic disease. Adrenals/Urinary Tract: Negative adrenals. No hydronephrosis or stone. Thick-walled bladder, likely chronic outlet obstruction. Urinary distension currently is moderate Stomach/Bowel: Formed stool distends most colonic segments. No acute inflammation. The proximal stomach appears invaded by pancreatic and peritoneal masses. 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 Splenic artery encasement and narrowing. There is broad contact of the common hepatic artery and low-density pancreatic/peripancreatic mass. Enlarged lymph nodes in the peripancreatic and porta hepatis region, also seen around the stomach following the greater omental vessels. Reproductive:Enlarged prostate projecting into the bladder base. Other: Generalized omental nodularity with largest deposits along the left paracolic gutter where there is deformation of the descending colon. These masses measure up to nearly 3 cm in size. Much of the stomach appears studded by tumor. Musculoskeletal: No noted hematogenous metastasis. These results will be called to the ordering clinician or representative by the Radiologist Assistant, and communication documented in the PACS or zVision Dashboard. IMPRESSION: 1. 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 Extensive metastatic disease is seen in the liver, upper retroperitoneum, and peritoneum. 2. 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 Electronically Signed   By: Monte Fantasia M.D.   On: 01/16/2019 09:40   US Renal  Result Date: 01/20/2019 CLINICAL DATA:  Acute renal failure. EXAM: RENAL / URINARY TRACT ULTRASOUND COMPLETE COMPARISON:  CT of the abdomen on 01/16/2019 FINDINGS: Right Kidney: Renal measurements: 13.2 x 6.4 x 4.7 cm = volume: 206 mL. No hydronephrosis. Minimally increased cortical echogenicity may be consistent with a component of chronic kidney disease. No solid masses. Simple cyst in the interpolar region measures 1.5 cm. Left Kidney: Renal measurements: 14.1 x 5.4 x 6.0 cm = volume: 242 mL. Mildly increased cortical echogenicity. Simple cyst present with the largest measuring 3 cm. No hydronephrosis. No solid masses. Bladder: Decompressed by Foley catheter. IMPRESSION: Mildly increased cortical echogenicity of both kidneys may be  consistent with a component of chronic kidney disease. No hydronephrosis bilaterally. Electronically Signed   By: Aletta Edouard M.D.   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  PLAN:    1.  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  Patient's CA 19-9 is also significantly elevated at 5656.  Given patient's declining performance status, treatment would likely be difficult.  Patient had consultation with his primary oncologist Dr. Mike Gip on Friday, January 19, 2019 at which time he and his wife were amenable to palliative care and hospice.  Patient is agreeable that upon discharge he wishes to pursue comfort care.  No biopsy is planned at this point. 2.  Acute renal failure: Patient is not  a candidate for dialysis.  Hospice as above. 3.  Hyponatremia: Multifactorial including acute kidney injury and underlying malignancy. 4.  Anemia: Patient hemoglobin 9.0 today.  Monitor.  He does not require transfusion at this point.  Appreciate consult, will follow.  Lloyd Huger, MD   01/20/2019 4:01 PM

## 2019-01-20 NOTE — Progress Notes (Signed)
   01/20/19 1100  Clinical Encounter Type  Visited With Patient and family together  Visit Type Initial  Referral From Nurse  Ch received an OR for AD education. Wife was at bedside and assisted the pt in completing the document. Ch provided education. Due to the lack of notaries at this time, ch is unable to finalize it. Ch informed the pt that they can reach out to ch this coming Monday to notarize the document.

## 2019-01-20 NOTE — Progress Notes (Signed)
[place] [facility] [place] NAME: Sherry Lupi    MR#:  [id number]  DATE OF BIRTH:  Aug 05, 1940  SUBJECTIVE:  CHIEF COMPLAINT:  No chief complaint on file. No complaints, hungry, weak REVIEW OF SYSTEMS:  Review of Systems  Constitutional: Positive for malaise/fatigue. Negative for diaphoresis, fever and weight loss.  HENT: Negative for ear discharge, ear pain, hearing loss, nosebleeds, sore throat and tinnitus.   Eyes: Negative for blurred vision and pain.  Respiratory: Negative for cough, hemoptysis, shortness of breath and wheezing.   Cardiovascular: Negative for chest pain, palpitations, orthopnea and leg swelling.  Gastrointestinal: Negative for abdominal pain, blood in stool, constipation, diarrhea, heartburn, nausea and vomiting.  Genitourinary: Negative for dysuria, frequency and urgency.  Musculoskeletal: Negative for back pain and myalgias.  Skin: Negative for itching and rash.  Neurological: Negative for dizziness, tingling, tremors, focal weakness, seizures, weakness and headaches.  Psychiatric/Behavioral: Negative for depression. The patient is not nervous/anxious.     DRUG ALLERGIES:   Allergies  Allergen Reactions  . Donepezil Diarrhea   VITALS:  Blood pressure 124/64, pulse 87, temperature 98.4 F (36.9 C), temperature source Oral, resp. rate 16, SpO2 100 %. PHYSICAL EXAMINATION:  Physical Exam HENT:     Head: Normocephalic and atraumatic.  Eyes:     Conjunctiva/sclera: Conjunctivae normal.     Pupils: Pupils are equal, round, and reactive to light.  Neck:     Musculoskeletal: Normal range of motion and neck supple.     Thyroid: No thyromegaly.     Trachea: No tracheal deviation.  Cardiovascular:     Rate and Rhythm: Normal rate and regular rhythm.     Heart sounds: Normal heart sounds.  Pulmonary:     Effort: Pulmonary effort is normal. No respiratory distress.     Breath sounds: Normal breath sounds. No  wheezing.  Chest:     Chest wall: No tenderness.  Abdominal:     General: Bowel sounds are normal. There is no distension.     Palpations: Abdomen is soft.     Tenderness: There is no abdominal tenderness.  Musculoskeletal: Normal range of motion.  Skin:    General: Skin is warm and dry.     Findings: No rash.  Neurological:     Mental Status: He is alert and oriented to person, place, and time.     Cranial Nerves: No cranial nerve deficit.    LABORATORY PANEL:  Male CBC Recent Labs  Lab 01/20/19 0628  WBC 13.0*  HGB 9.0*  HCT 25.3*  PLT 231   ------------------------------------------------------------------------------------------------------------------ Chemistries  Recent Labs  Lab 01/19/19 1248 01/20/19 0628  NA 126* 129*  K 3.9 4.1  CL 90* 93*  CO2 17* 19*  GLUCOSE 181* 96  BUN 69* 78*  CREATININE 7.40* 7.95*  CALCIUM 7.5* 7.1*  AST 174*  --   ALT 179*  --   ALKPHOS 1,740*  --   BILITOT 7.7*  --    RADIOLOGY:  US Renal  Result Date: 01/20/2019 CLINICAL DATA:  Acute renal failure. EXAM: RENAL / URINARY TRACT ULTRASOUND COMPLETE COMPARISON:  CT of the abdomen on 01/16/2019 FINDINGS: Right Kidney: Renal measurements: 13.2 x 6.4 x 4.7 cm = volume: 206 mL. No hydronephrosis. Minimally increased cortical echogenicity may be consistent with a component of chronic kidney disease. No solid masses. Simple cyst in the interpolar region measures 1.5 cm. Left Kidney: Renal measurements: 14.1 x 5.4  x 6.0 cm = volume: 242 mL. Mildly increased cortical echogenicity. Simple cyst present with the largest measuring 3 cm. No hydronephrosis. No solid masses. Bladder: Decompressed by Foley catheter. IMPRESSION: Mildly increased cortical echogenicity of both kidneys may be consistent with a component of chronic kidney disease. No hydronephrosis bilaterally. Electronically Signed   By: Aletta Edouard M.D.   On: 01/20/2019 10:51   ASSESSMENT AND PLAN:  Pt is a 78 y.o.  African-American  male with Hypertension, diabetes, was admitted for weakness, weight loss, poor appetite and is found to have acute renal failure and metastatic cancer in the abdomen.    # Patient had urinary retention yesterday and had to be replaced.   -Continue Flomax.  #Hyponatremia -Likely secondary to acute kidney injury.  Expected to resolve with improving renal function  1.  Acute kidney injury -placed Foley catheter on admission as he was retaining urine -Continue IV fluid hydration.hold all antihypertensive medications and medications that can affect kidney function (Norvasc, Vasotec, hydrochlorothiazide, Glucophage, metoprolol).  Patient does not want dialysis if his kidney function does not improve. - Renal ultrasound shows mild increased cortical echogenicity bilaterally.  No hydronephrosis. -Nephrology consult  2.  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  Elevated liver function test and bilirubin.  Will hold Plavix just in case oncology wants to do a biopsy.  Palliative care consultation.  Patient is a DO NOT RESUSCITATE. -Dr. Mike Gip aware  3.  BPH.  Continue Flomax and finasteride.    4.  Relative hypotension.  Hold all antihypertensive medications including Norvasc, Vasotec, hydrochlorothiazide and metoprolol.  IV fluid hydration.  5.  Type 2 diabetes mellitus.  Hold insulin and Glucophage and put on sliding scale at this point.  6.  History of stroke.  Hold Plavix just in case oncology wants to do a biopsy.     All the records are reviewed and case discussed with Care Management/Social Worker. Management plans discussed with the patient, nephrology, nursing and they are in agreement.  CODE STATUS: DNR  TOTAL TIME TAKING CARE OF THIS PATIENT: 35 minutes.   More than 50% of the time was spent in counseling/coordination of care: YES  POSSIBLE D/C IN 2-3 DAYS, DEPENDING ON CLINICAL CONDITION.   Max Sane M.D on 01/20/2019 at 11:27 AM   Between 7am to 6pm - Pager - [phone]  After 6pm go to [email or web address] - Technical brewer [facility]  Office  [phone]  CC: Primary care physician; Ezequiel Kayser, MD  Note: This dictation was prepared with Dragon dictation along with smaller phrase technology. Any transcriptional errors that result from this process are unintentional.

## 2019-01-20 NOTE — Consult Note (Signed)
[address] [place], [place] [address] Phone [phone]. Fax [phone]  Date: 01/20/2019                  Patient Name:  Darryl Carter  MRN: [id number]  DOB: 1940/12/10  Age / Sex: 78 y.o., male         PCP: Ezequiel Kayser, MD                 Service Requesting Consult:  Internal medicine/ Max Sane, MD                 Reason for Consult:  Acute renal failure            History of Present Illness: Patient is a 78 y.o. male with medical problems of hypertension and diabetes, who was admitted to [facility] on 01/19/2019 for evaluation of weight loss, decreased appetite.  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  Baseline creatinine of 1.2 from January 08, 2019 Admission creatinine 7.40 which has increased to 7.95 Blood pressure noted to be low at admission at 92/54 Home blood pressure regimen includes amlodipine, enalapril, hydrochlorothiazide  Patient reports that he has been feeling weak for the past few weeks.  No energy.  Decreased appetite.  Not able to eat or drink much.  No fevers or chills.     Medications: Outpatient medications: Medications Prior to Admission  Medication Sig Dispense Refill Last Dose  . amLODipine (NORVASC) 10 MG tablet Take 1 tablet (10 mg total) by mouth daily. 90 tablet 1 01/18/2019 at Unknown time  . atorvastatin (LIPITOR) 80 MG tablet Take 1 tablet (80 mg total) by mouth daily at 6 PM. 90 tablet 1 01/18/2019 at unknown  . clopidogrel (PLAVIX) 75 MG tablet Take 1 tablet (75 mg total) by mouth daily. (Patient taking differently: Take 75 mg by mouth daily. PM) 90 tablet 3 01/18/2019 at unknown  . enalapril (VASOTEC) 20 MG tablet Take 1 tablet (20 mg total) by mouth 2 (two) times daily. (Patient taking differently: Take 20 mg by mouth 2 (two) times daily. MORNING  AND NIGHT) 180 tablet 1 01/18/2019 at unknown  . finasteride (PROSCAR) 5 MG tablet Take 1 tablet (5 mg total) by mouth daily. 30 tablet 11 01/18/2019 at unknown  . hydrochlorothiazide (HYDRODIURIL) 25 MG tablet Take 1 tablet (25 mg total) by mouth daily. (Patient taking differently: Take 25 mg by mouth daily. PM) 90 tablet 1 01/18/2019 at unknown  . insulin NPH-regular Human (70-30) 100 UNIT/ML injection Inject 20 Units into the skin 2 (two) times daily with a meal.    01/18/2019 at Unknown time  . memantine (NAMENDA) 10 MG tablet Take 10 mg by mouth 2 (two) times daily.   01/18/2019 at unknown  . metoprolol (LOPRESSOR) 50 MG tablet Take 1 tablet (50 mg total) by mouth 2 (two) times daily. 180 tablet 1 01/18/2019 at unknown  . ondansetron (ZOFRAN-ODT) 4 MG disintegrating tablet Take 4 mg by mouth every 8 (eight) hours as needed.    prn at prn  . tamsulosin (FLOMAX) 0.4 MG CAPS capsule Take 0.4 mg by mouth daily.    01/18/2019 at unknown  . vitamin C (ASCORBIC ACID) 500 MG tablet Take 500 mg by mouth daily. AM   01/18/2019 at Unknown time    Current medications: Current Facility-Administered Medications  Medication Dose Route Frequency Provider  Last Rate Last Dose  . 0.9 %  sodium chloride infusion   Intravenous Continuous Max Sane, MD 100 mL/hr at 01/20/19 1007    . acetaminophen (TYLENOL) tablet 650 mg  650 mg Oral Q6H PRN Loletha Grayer, MD       Or  . acetaminophen (TYLENOL) suppository 650 mg  650 mg Rectal Q6H PRN Wieting, Richard, MD      . Chlorhexidine Gluconate Cloth 2 % PADS 6 each  6 each Topical Daily Manuella Ghazi, Vipul, MD      . finasteride (PROSCAR) tablet 5 mg  5 mg Oral Daily Loletha Grayer, MD   5 mg at 01/20/19 1019  . insulin aspart (novoLOG) injection 0-5 Units  0-5 Units Subcutaneous QHS Wieting, Richard, MD      . insulin aspart (novoLOG) injection 0-9 Units  0-9 Units Subcutaneous TID WC Loletha Grayer, MD   2 Units at 01/19/19 1753  . memantine (NAMENDA) tablet 5 mg   5 mg Oral BID Loletha Grayer, MD   5 mg at 01/20/19 1019  . ondansetron (ZOFRAN) injection 4 mg  4 mg Intravenous Q6H PRN Wieting, Richard, MD      . tamsulosin [facility]) capsule 0.4 mg  0.4 mg Oral Daily Loletha Grayer, MD   0.4 mg at 01/20/19 1019      Allergies: Allergies  Allergen Reactions  . Donepezil Diarrhea      Past Medical History: Past Medical History:  Diagnosis Date  . Diabetes mellitus without complication (HCC)    INSULIN  . Dysrhythmia   . Heart murmur   . Hypercholesterolemia   . Hypertension    CONTROLLED ON MEDS  . Renal insufficiency   . Stroke [facility])    CVA '05  . Wears dentures    UPPER AND LOWER     Past Surgical History: Past Surgical History:  Procedure Laterality Date  . CATARACT EXTRACTION Left 2010  . CATARACT EXTRACTION W/PHACO Right 07/14/2016   Procedure: CATARACT EXTRACTION PHACO AND INTRAOCULAR LENS PLACEMENT ([place]) Diabetic Right;  Surgeon: Leandrew Koyanagi, MD;  Location: [facility];  Service: Ophthalmology;  Laterality: Right;  DIABETES-INSULIN DEPENDENT  . COLONOSCOPY    . COLONOSCOPY WITH PROPOFOL N/A 03/30/2017   Procedure: COLONOSCOPY WITH PROPOFOL;  Surgeon: Toledo, [name], MD;  Location: [facility] ENDOSCOPY;  Service: Gastroenterology;  Laterality: N/A;     Family History: Family History  Problem Relation Age of Onset  . Hypertension Mother   . Diabetes Mother   . Hypertension Father   . Diabetes Father   . Hypertension Sister   . Diabetes Sister   . Colon cancer Sister   . Hypertension Brother   . Diabetes Brother      Social History: Social History   Socioeconomic History  . Marital status: Married    Spouse name: Not on file  . Number of children: Not on file  . Years of education: Not on file  . Highest education level: Not on file  Occupational History  . Not on file  Social Needs  . Financial resource strain: Not on file  . Food insecurity    Worry: Not on file    Inability: Not on file   . Transportation needs    Medical: Not on file    Non-medical: Not on file  Tobacco Use  . Smoking status: Former Smoker    Packs/day: 2.00    Years: 34.00    Pack years: 68.00    Types: Cigarettes    Quit date:  04/24/2001    Years since quitting: 17.7  . Smokeless tobacco: Never Used  Substance and Sexual Activity  . Alcohol use: No    Alcohol/week: 0.0 standard drinks    Comment: Quit in 2003  . Drug use: No  . Sexual activity: Not on file  Lifestyle  . Physical activity    Days per week: Not on file    Minutes per session: Not on file  . Stress: Not on file  Relationships  . Social Herbalist on phone: Not on file    Gets together: Not on file    Attends religious service: Not on file    Active member of club or organization: Not on file    Attends meetings of clubs or organizations: Not on file    Relationship status: Not on file  . Intimate partner violence    Fear of current or ex partner: Not on file    Emotionally abused: Not on file    Physically abused: Not on file    Forced sexual activity: Not on file  Other Topics Concern  . Not on file  Social History Narrative  . Not on file     Review of Systems: Gen: No fevers or chills.  Positive for poor appetite, weight loss HEENT: No vision or hearing complaints CV: No chest pain or shortness of breath Resp: No cough or hemoptysis GI: Appetite has been poor.  No diarrhea or blood in the stool.  Does feel constipated GU : Occasional difficulty with voiding.  Yesterday had urinary retention therefore Foley catheter was placed MS: Denies any acute complaints Derm:    No complaints Psych: No complaints Heme: No complaints Neuro: No complaints Endocrine.  No complaints  Vital Signs: Blood pressure 124/64, pulse 87, temperature 98.4 F (36.9 C), temperature source Oral, resp. rate 16, SpO2 100 %.   Intake/Output Summary (Last 24 hours) at 01/20/2019 1054 Last data filed at 01/19/2019 1653 Gross  per 24 hour  Intake 89.39 ml  Output 345 ml  Net -255.61 ml    Weight trends: There were no vitals filed for this visit.  Gen:   Alert, cooperative, no distress, appears stated age Head:   Normocephalic, without obvious abnormality, atraumatic Eyes/ENT:   moist oral mucus membranes Neck:  Supple,  thyroid: not enlarged, no JVD Lungs:   Clear to auscultation bilaterally, respirations unlabored Heart:   Regular rate and rhythm, S1, S2 normal, no murmur, rub or gallop Abdomen:   Soft, non-tender, bowel sounds active, mildly distended Extremities: no cyanosis or edema Skin:  Skin color, texture, turgor normal, no rashes or lesions Neurologic: Alert and oriented, able to answer questions appropriately     Lab results: Basic Metabolic Panel: Recent Labs  Lab 01/19/19 1248 01/20/19 0628  NA 126* 129*  K 3.9 4.1  CL 90* 93*  CO2 17* 19*  GLUCOSE 181* 96  BUN 69* 78*  CREATININE 7.40* 7.95*  CALCIUM 7.5* 7.1*    Liver Function Tests: Recent Labs  Lab 01/19/19 1248  AST 174*  ALT 179*  ALKPHOS 1,740*  BILITOT 7.7*  PROT 6.0*  ALBUMIN 2.1*   No results for input(s): LIPASE, AMYLASE in the last 168 hours. Recent Labs  Lab 01/19/19 1248  AMMONIA 19    CBC: Recent Labs  Lab 01/19/19 1248 01/20/19 0628  WBC 14.6* 13.0*  NEUTROABS 12.9*  --   HGB 10.0* 9.0*  HCT 28.2* 25.3*  MCV 69.1* 68.4*  PLT 280 231  Cardiac Enzymes: No results for input(s): CKTOTAL, TROPONINI in the last 168 hours.  BNP: Invalid input(s): POCBNP  CBG: Recent Labs  Lab 01/19/19 1517 01/19/19 1700 01/19/19 2154 01/20/19 0734  GLUCAP 196* 172* 114* 94    Microbiology: Recent Results (from the past 720 hour(s))  Novel Coronavirus, NAA ([facility])     Status: None   Collection Time: 01/12/19 12:00 AM   Specimen: Nasopharyngeal(NP) swabs in vial transport medium   NASOPHARYNGE  TESTING  Result Value Ref Range Status   SARS-CoV-2, NAA Not Detected Not Detected Final     Comment: This nucleic acid amplification test was developed and its performance characteristics determined by [employer]. Nucleic acid amplification tests include PCR and TMA. This test has not been FDA cleared or approved. This test has been authorized by FDA under an Emergency Use Authorization (EUA). This test is only authorized for the duration of time the declaration that circumstances exist justifying the authorization of the emergency use of in vitro diagnostic tests for detection of SARS-CoV-2 virus and/or diagnosis of COVID-19 infection under section 564(b)(1) of the Act, 21 U.S.C. [id number]) (1), unless the authorization is terminated or revoked sooner. When diagnostic testing is negative, the possibility of a false negative result should be considered in the context of a patient's recent exposures and the presence of clinical signs and symptoms consistent with COVID-19. An individual without symptoms of COVID-19 and who is not shedding SARS-CoV-2 virus would  expect to have a negative (not detected) result in this assay.   SARS CORONAVIRUS 2 (TAT 6-24 HRS) Nasopharyngeal Nasopharyngeal Swab     Status: None   Collection Time: 01/19/19  7:20 PM   Specimen: Nasopharyngeal Swab  Result Value Ref Range Status   SARS Coronavirus 2 NEGATIVE NEGATIVE Final    Comment: (NOTE) SARS-CoV-2 target nucleic acids are NOT DETECTED. The SARS-CoV-2 RNA is generally detectable in upper and lower respiratory specimens during the acute phase of infection. Negative results do not preclude SARS-CoV-2 infection, do not rule out co-infections with other pathogens, and should not be used as the sole basis for treatment or other patient management decisions. Negative results must be combined with clinical observations, patient history, and epidemiological information. The expected result is Negative. Fact Sheet for Patients: SugarRoll.be Fact Sheet for  Healthcare Providers: https://www.woods-mathews.com/ This test is not yet approved or cleared by the [place] FDA and  has been authorized for detection and/or diagnosis of SARS-CoV-2 by FDA under an Emergency Use Authorization (EUA). This EUA will remain  in effect (meaning this test can be used) for the duration of the COVID-19 declaration under Section 56 4(b)(1) of the Act, 21 U.S.C. section 360bbb-3(b)(1), unless the authorization is terminated or revoked sooner. Performed at [facility], [place] [address] [place], [place] [address]      Coagulation Studies: No results for input(s): LABPROT, INR in the last 72 hours.  Urinalysis: Recent Labs    01/19/19 1744  COLORURINE AMBER*  LABSPEC 1.017  PHURINE 5.0  GLUCOSEU NEGATIVE  HGBUR NEGATIVE  BILIRUBINUR NEGATIVE  KETONESUR NEGATIVE  PROTEINUR 30*  NITRITE NEGATIVE  LEUKOCYTESUR NEGATIVE        Imaging: US Renal  Result Date: 01/20/2019 CLINICAL DATA:  Acute renal failure. EXAM: RENAL / URINARY TRACT ULTRASOUND COMPLETE COMPARISON:  CT of the abdomen on 01/16/2019 FINDINGS: Right Kidney: Renal measurements: 13.2 x 6.4 x 4.7 cm = volume: 206 mL. No hydronephrosis. Minimally increased cortical echogenicity may be consistent with a component of  chronic kidney disease. No solid masses. Simple cyst in the interpolar region measures 1.5 cm. Left Kidney: Renal measurements: 14.1 x 5.4 x 6.0 cm = volume: 242 mL. Mildly increased cortical echogenicity. Simple cyst present with the largest measuring 3 cm. No hydronephrosis. No solid masses. Bladder: Decompressed by Foley catheter. IMPRESSION: Mildly increased cortical echogenicity of both kidneys may be consistent with a component of chronic kidney disease. No hydronephrosis bilaterally. Electronically Signed   By: Aletta Edouard M.D.   On: 01/20/2019 10:51     Assessment & Plan: Pt is a 78 y.o. African-American  male with Hypertension, diabetes, was  admitted on 01/19/2019 with weakness, weight loss, poor appetite and is found to have acute renal failure and metastatic cancer in the abdomen.   #Acute renal failure -Patient's baseline creatinine is normal from January 08, 2019 at 1.2 - Renal ultrasound shows mild increased cortical echogenicity bilaterally.  No hydronephrosis. -Urinalysis shows mild proteinuria, 11-20 RBCs -Acute kidney injury is likely secondary to hypotension, volume depletion in the setting of multiple antihypertensives including enalapril and hydrochlorothiazide.  Agree with holding all antihypertensives.  -Electrolytes and volume status are acceptable.  No acute indication for dialysis at present.  # Patient had urinary retention yesterday and had to be replaced.   -Continue Flomax.  #Hyponatremia -Likely secondary to acute kidney injury.  Expected to resolve with improving renal function  #Suspected pancreas adenocarcinoma with extensive metastasis in liver, peritoneum and upper retroperitoneum, mass occluding splenic vein -Management as per hospitalist/oncology team    LOS: 1 Jhovani Griswold 10/31/202010:54 AM    Note: This note was prepared with Dragon dictation. Any transcription errors are unintentional

## 2019-01-21 DIAGNOSIS — C772 Secondary and unspecified malignant neoplasm of intra-abdominal lymph nodes: Secondary | ICD-10-CM

## 2019-01-21 DIAGNOSIS — N179 Acute kidney failure, unspecified: Principal | ICD-10-CM

## 2019-01-21 DIAGNOSIS — C259 Malignant neoplasm of pancreas, unspecified: Secondary | ICD-10-CM

## 2019-01-21 LAB — GLUCOSE, CAPILLARY
Glucose-Capillary: 81 mg/dL (ref 70–99)
Glucose-Capillary: 101 mg/dL — ABNORMAL HIGH (ref 70–99)
Glucose-Capillary: 139 mg/dL — ABNORMAL HIGH (ref 70–99)

## 2019-01-21 LAB — COMPREHENSIVE METABOLIC PANEL
ALT: 164 U/L — ABNORMAL HIGH (ref 0–44)
AST: 124 U/L — ABNORMAL HIGH (ref 15–41)
Albumin: 1.8 g/dL — ABNORMAL LOW (ref 3.5–5.0)
Alkaline Phosphatase: 1610 U/L — ABNORMAL HIGH (ref 38–126)
Anion gap: 17 — ABNORMAL HIGH (ref 5–15)
BUN: 93 mg/dL — ABNORMAL HIGH (ref 8–23)
CO2: 16 mmol/L — ABNORMAL LOW (ref 22–32)
Calcium: 7.2 mg/dL — ABNORMAL LOW (ref 8.9–10.3)
Chloride: 95 mmol/L — ABNORMAL LOW (ref 98–111)
Creatinine, Ser: 8.51 mg/dL — ABNORMAL HIGH (ref 0.61–1.24)
GFR calc Af Amer: 6 mL/min — ABNORMAL LOW (ref >60)
GFR calc non Af Amer: 5 mL/min — ABNORMAL LOW (ref >60)
Glucose, Bld: 86 mg/dL (ref 70–99)
Potassium: 4.4 mmol/L (ref 3.5–5.1)
Sodium: 128 mmol/L — ABNORMAL LOW (ref 135–145)
Total Bilirubin: 8.5 mg/dL — ABNORMAL HIGH (ref 0.3–1.2)
Total Protein: 5.4 g/dL — ABNORMAL LOW (ref 6.5–8.1)

## 2019-01-21 LAB — CBC
HCT: 25 % — ABNORMAL LOW (ref 39.0–52.0)
Hemoglobin: 8.5 g/dL — ABNORMAL LOW (ref 13.0–17.0)
MCH: 24 pg — ABNORMAL LOW (ref 26.0–34.0)
MCHC: 34 g/dL (ref 30.0–36.0)
MCV: 70.6 fL — ABNORMAL LOW (ref 80.0–100.0)
Platelets: 229 10*3/uL (ref 150–400)
RBC: 3.54 MIL/uL — ABNORMAL LOW (ref 4.22–5.81)
RDW: 18.9 % — ABNORMAL HIGH (ref 11.5–15.5)
WBC: 14.5 10*3/uL — ABNORMAL HIGH (ref 4.0–10.5)
nRBC: 0 % (ref 0.0–0.2)

## 2019-01-21 MED ORDER — ENSURE MAX PROTEIN PO LIQD
11.0000 [oz_av] | Freq: Two times a day (BID) | ORAL | Status: DC
  Administered 2019-01-21: 14:00:00 11 [oz_av] via ORAL
  Filled 2019-01-21: qty 330

## 2019-01-21 NOTE — Plan of Care (Signed)

## 2019-01-21 NOTE — Progress Notes (Signed)
Initial Nutrition Assessment  RD working remotely.  DOCUMENTATION CODES:   Not applicable  INTERVENTION:  Recommend liberalizing diet to carbohydrate modified. Patient may also benefit from regular diet in setting of poor PO intake as he would be unlikely to meet carbohydrate restrictions on carbohydrate modified diet.  Provide Ensure Max Protein po BID, each supplement provides 150 kcal and 30 grams of protein. Patient prefers vanilla.  NUTRITION DIAGNOSIS:   Inadequate oral intake related to decreased appetite as evidenced by per patient/family report.  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   -Per Oncology note from yesterday patient wishes to pursue comfort care after discharge.  Spoke with patient's wife over the phone as patient was unavailable at time of call. Wife reports that patient has had a decreased appetite and intake for a while now. He eats <25% of his meals. He was drinking vanilla Ensure Max at home and enjoyed those. He is amenable to drinking them here as well.  Patient's UBW was 185-190 lbs. Wife reports patent is now around 155 lbs. Per chart patient was 85.3 kg on 06/02/2018 and 70.8 kg on 01/19/2019. He lost 14.5 kg (17% body weight) over 7 months, which is significant for time frame.  Medications reviewed and include: Novolog 0-9 units TID, Novolog 0-5 units QHS, Miralax 17 grams daily, Flomax, NS at 100 mL/hr.  Labs reviewed: CBG 81-111, Sodium 128, Chloride 95, CO2 16, BUN 93, Creatinine 8.51.  Patient is at risk for malnutrition. Unable to determine if he meets criteria without calculating exact  calorie intake PTA or completing NFPE.  NUTRITION - FOCUSED PHYSICAL EXAM:  Unable to complete at this time.  Diet Order:   Diet Order            Diet heart healthy/carb modified Room service appropriate? Yes; Fluid consistency: Thin  Diet effective now             EDUCATION NEEDS:   No education needs have been identified at this time  Skin:  Skin Assessment: Reviewed RN Assessment  Last BM:  Unknown  Height:   Ht Readings from Last 1 Encounters:  01/19/19 5\' 8"  (1.727 m)   Weight:   Wt Readings from Last 1 Encounters:  01/19/19 70.8 kg   Ideal Body Weight:  70 kg  BMI:  There is no height or weight on file to calculate BMI.  Estimated Nutritional Needs:   Kcal:  [id number]  Protein:  90-100 grams  Fluid:  1.8-2 L/day  Willey Blade, MS, RD, LDN Office: [phone] Pager: [phone] After Hours/Weekend Pager: [phone]

## 2019-01-21 NOTE — Discharge Instructions (Signed)
Hospice °Hospice is a service that is designed to provide people who are terminally ill and their families with medical, spiritual, and psychological support. Its aim is to improve your quality of life by keeping you as comfortable as possible in the final stages of life. °Who will be my providers when I begin hospice care? °Hospice teams often include: °· A nurse. °· A doctor. The hospice doctor will be available for your care, but you can include your regular doctor or nurse practitioner. °· A social worker. °· A counselor. °· A religious leader (such as a chaplain). °· A dietitian. °· Therapists. °· Trained volunteers who can help with care. °What services does hospice provide? °Hospice services can vary depending on the center or organization. Generally, they include: °· Ways to keep you comfortable, such as: °? Providing care in your home or in a home-like setting. °? Working with your family and friends to help meet your needs. °? Allowing you to enjoy the support of loved ones by receiving much of your basic care from family and friends. °· Pain relief and symptom management. The staff will supply all necessary medicines and equipment so that you can stay comfortable and alert enough to enjoy the company of your friends and family. °· Visits or care from a nurse and doctor. This may include 24-hour on-call services. °· Companionship when you are alone. °· Allowing you and your family to rest. Hospice staff may do light housekeeping, prepare meals, and run errands. °· Counseling. They will make sure your emotional, spiritual, and social needs are being met, as well as those needs of your family members. °· Spiritual care. This will be individualized to meet your needs and your family's needs. It may involve: °? Helping you and your family understand the dying process. °? Helping you say goodbye to your family and friends. °? Performing a specific religious ceremony or ritual. °· Massage. °· Nutrition  therapy. °· Physical and occupational therapy. °· Short-term inpatient care, if something cannot be managed in the home. °· Art or music therapy. °· Bereavement support for grieving family members. °When should hospice care begin? °Most people who use hospice are believed to have less than 6 months to live. °· Your family and health care providers can help you decide when hospice services should begin. °· If you live longer than 6 months but your condition does not improve, your doctor may be able to approve you for continued hospice care. °· If your condition improves, you may discontinue the program. °What should I consider before selecting a program? °Most hospice programs are run by nonprofit, independent organizations. Some are affiliated with hospitals, nursing homes, or home health care agencies. Hospice programs can take place in your home or at a hospice center, hospital, or skilled nursing facility. When choosing a hospice program, ask the following questions: °· What services are available to me? °· What services will be offered to my loved ones? °· How involved will my loved ones be? °· How involved will my health care provider be? °· Who makes up the hospice care team? How are they trained or screened? °· How will my pain and symptoms be managed? °· If my circumstances change, can the services be provided in a different setting, such as my home or in the hospital? °· Is the program reviewed and licensed by the state or certified in some other way? °· What does it cost? Is it covered by insurance? °· If I choose a hospice   center or nursing home, where is the hospice center located? Is it convenient for family and friends? °· If I choose a hospice center or nursing home, can my family and friends visit any time? °· Will you provide emotional and spiritual support? °· Who can my family call with questions? °Where can I learn more about hospice? °You can learn about existing hospice programs in your area  from your health care providers. You can also read more about hospice online. The websites of the following organizations have helpful information: °· National Hospice and Palliative Care Organization (NHPCO): www.nhpco.org °· National Association for Home Care & Hospice (NAHC): www.nahc.org °· Hospice Foundation of America (HFA): www.hospicefoundation.org °· American Cancer Society (ACS): www.cancer.org °· Hospice Net: www.hospicenet.org °· Visiting Nurse Associations of America (VNAA): www.vnaa.org °You may also find more information by contacting the following agencies: °· A local agency on aging. °· Your local United Way chapter. °· Your state's department of health or social services. °Summary °· Hospice is a service that is designed to provide people who are terminally ill and their families with medical, spiritual, and psychological support. °· Hospice aims to improve your quality of life by keeping you as comfortable as possible in the final stages of life. °· Hospice teams often include a doctor, nurse, social worker, counselor, religious leader,dietitian, therapists, and volunteers. °· Hospice care generally includes medicine for symptom management, visits from doctors and nurses, physical and occupational therapy, nutrition counseling, spiritual and emotional counseling, caregiver support, and bereavement support for grieving family members. °· Hospice programs can take place in your home or at a hospice center, hospital, or skilled nursing facility. °This information is not intended to replace advice given to you by your health care provider. Make sure you discuss any questions you have with your health care provider. °Document Released: 06/25/2003 Document Revised: 02/18/2017 Document Reviewed: 03/30/2016 °Elsevier Patient Education © 2020 [employer] Inc. ° °

## 2019-01-21 NOTE — TOC Transition Note (Addendum)
Transition of Care [facility]) - CM/SW Discharge Note   Patient Details  Name: Darryl Carter MRN: [id number] Date of Birth: [birth year]  Transition of Care [facility]) CM/SW Contact:  Katrina Stack, RN Phone Number: 01/21/2019, 4:47 PM   Clinical Narrative:   Patient and wife decline recommendation for skilled nursing.  Patient and wife in agreement for plan to discharge home with hospice.  Agency preference is [facility].  Called referral to April.  Relayed that patient will need a rolling walker and bedside commode.  [facility] should be calling patient's wife about delivery.  Patient will be open to care 11/3.  CM provided a used donated walker  for patient safety in the event the walker does not get delivered tonight. Requested DNR completion from attending.  Patient and wife have no questions. Readm Risk assessment entered. Wife to transport home. Denies issues or concerns regarding getting patient into his home from the car. Faxed referral information through Epic to agency.  DC summary is pending    Final next level of care: Home w Hospice Care Barriers to Discharge: No Barriers Identified   Patient Goals and CMS Choice Patient states their goals for this hospitalization and ongoing recovery are:: Go home and ot a nursing home CMS Medicare.gov Compare Post Acute Care list provided to:: Patient Choice offered to / list presented to : Patient  Discharge Placement                       Discharge Plan and Services                DME Arranged: Bedside commode, Walker rolling DME Agency: [facility] Date DME Agency Contacted: 01/21/19(By April with [place])   Representative spoke with at DME Agency: April   [place]: [facility] Date [place]: 01/21/19 Time [place]: 1646 Representative spoke with at [place]: [facility]  Social Determinants of Health ([place]) Interventions     Readmission Risk  Interventions Readmission Risk Prevention Plan 01/21/2019  Transportation Screening Complete  PCP or Specialist Appt within 5-7 Days Complete  Home Care Screening Complete  Medication Review (RN CM) Complete  Some recent data might be hidden

## 2019-01-22 ENCOUNTER — Telehealth: Payer: Self-pay

## 2019-01-22 ENCOUNTER — Emergency Department
Admission: EM | Admit: 2019-01-22 | Discharge: 2019-02-20 | Disposition: E | Payer: Medicare Other | Attending: Emergency Medicine | Admitting: Emergency Medicine

## 2019-01-22 ENCOUNTER — Emergency Department: Payer: Medicare Other

## 2019-01-22 ENCOUNTER — Other Ambulatory Visit: Payer: Self-pay

## 2019-01-22 ENCOUNTER — Encounter: Payer: Self-pay | Admitting: *Deleted

## 2019-01-22 DIAGNOSIS — Z79899 Other long term (current) drug therapy: Secondary | ICD-10-CM | POA: Insufficient documentation

## 2019-01-22 DIAGNOSIS — R4182 Altered mental status, unspecified: Secondary | ICD-10-CM | POA: Insufficient documentation

## 2019-01-22 DIAGNOSIS — I10 Essential (primary) hypertension: Secondary | ICD-10-CM | POA: Diagnosis not present

## 2019-01-22 DIAGNOSIS — C259 Malignant neoplasm of pancreas, unspecified: Secondary | ICD-10-CM | POA: Diagnosis not present

## 2019-01-22 DIAGNOSIS — Z87891 Personal history of nicotine dependence: Secondary | ICD-10-CM | POA: Insufficient documentation

## 2019-01-22 DIAGNOSIS — Z8673 Personal history of transient ischemic attack (TIA), and cerebral infarction without residual deficits: Secondary | ICD-10-CM | POA: Insufficient documentation

## 2019-01-22 DIAGNOSIS — E119 Type 2 diabetes mellitus without complications: Secondary | ICD-10-CM | POA: Insufficient documentation

## 2019-01-22 DIAGNOSIS — C787 Secondary malignant neoplasm of liver and intrahepatic bile duct: Secondary | ICD-10-CM | POA: Insufficient documentation

## 2019-01-22 DIAGNOSIS — N19 Unspecified kidney failure: Secondary | ICD-10-CM

## 2019-01-22 LAB — DIFFERENTIAL
Abs Immature Granulocytes: 0.17 10*3/uL — ABNORMAL HIGH (ref 0.00–0.07)
Basophils Absolute: 0 10*3/uL (ref 0.0–0.1)
Basophils Relative: 0 %
Eosinophils Absolute: 0 10*3/uL (ref 0.0–0.5)
Eosinophils Relative: 0 %
Immature Granulocytes: 1 %
Lymphocytes Relative: 7 %
Lymphs Abs: 1.4 10*3/uL (ref 0.7–4.0)
Monocytes Absolute: 1.1 10*3/uL — ABNORMAL HIGH (ref 0.1–1.0)
Monocytes Relative: 6 %
Neutro Abs: 17.6 10*3/uL — ABNORMAL HIGH (ref 1.7–7.7)
Neutrophils Relative %: 86 %

## 2019-01-22 LAB — LACTIC ACID, PLASMA: Lactic Acid, Venous: 1.8 mmol/L (ref 0.5–1.9)

## 2019-01-22 LAB — COMPREHENSIVE METABOLIC PANEL
ALT: 200 U/L — ABNORMAL HIGH (ref 0–44)
AST: 145 U/L — ABNORMAL HIGH (ref 15–41)
Albumin: 1.9 g/dL — ABNORMAL LOW (ref 3.5–5.0)
Alkaline Phosphatase: 1578 U/L — ABNORMAL HIGH (ref 38–126)
Anion gap: 18 — ABNORMAL HIGH (ref 5–15)
BUN: 117 mg/dL — ABNORMAL HIGH (ref 8–23)
CO2: 18 mmol/L — ABNORMAL LOW (ref 22–32)
Calcium: 8.2 mg/dL — ABNORMAL LOW (ref 8.9–10.3)
Chloride: 90 mmol/L — ABNORMAL LOW (ref 98–111)
Creatinine, Ser: 9.45 mg/dL — ABNORMAL HIGH (ref 0.61–1.24)
GFR calc Af Amer: 6 mL/min — ABNORMAL LOW (ref >60)
GFR calc non Af Amer: 5 mL/min — ABNORMAL LOW (ref >60)
Glucose, Bld: 311 mg/dL — ABNORMAL HIGH (ref 70–99)
Potassium: 5.9 mmol/L — ABNORMAL HIGH (ref 3.5–5.1)
Sodium: 126 mmol/L — ABNORMAL LOW (ref 135–145)
Total Bilirubin: 10.1 mg/dL — ABNORMAL HIGH (ref 0.3–1.2)
Total Protein: 5.8 g/dL — ABNORMAL LOW (ref 6.5–8.1)

## 2019-01-22 LAB — CBC
HCT: 26.1 % — ABNORMAL LOW (ref 39.0–52.0)
Hemoglobin: 8.6 g/dL — ABNORMAL LOW (ref 13.0–17.0)
MCH: 24.2 pg — ABNORMAL LOW (ref 26.0–34.0)
MCHC: 33 g/dL (ref 30.0–36.0)
MCV: 73.5 fL — ABNORMAL LOW (ref 80.0–100.0)
Platelets: 338 10*3/uL (ref 150–400)
RBC: 3.55 MIL/uL — ABNORMAL LOW (ref 4.22–5.81)
RDW: 20.2 % — ABNORMAL HIGH (ref 11.5–15.5)
WBC: 20.3 10*3/uL — ABNORMAL HIGH (ref 4.0–10.5)
nRBC: 0 % (ref 0.0–0.2)

## 2019-01-22 MED ORDER — MORPHINE SULFATE (PF) 2 MG/ML IV SOLN: 1.0000 mg | INTRAVENOUS | Status: DC | PRN

## 2019-01-22 MED ORDER — DEXMEDETOMIDINE HCL IN NACL 400 MCG/100ML IV SOLN
0.0000 ug/kg/h | INTRAVENOUS | Status: DC
  Administered 2019-01-22: 0.4 ug/kg/h via INTRAVENOUS
  Filled 2019-01-22: qty 100

## 2019-01-22 MED ORDER — SODIUM CHLORIDE 0.9% FLUSH: 3.0000 mL | Freq: Once | INTRAVENOUS | Status: DC

## 2019-01-22 MED ORDER — ROCURONIUM BROMIDE 50 MG/5ML IV SOLN
100.0000 mg | Freq: Once | INTRAVENOUS | Status: AC
  Administered 2019-01-22: 19:00:00 100 mg via INTRAVENOUS
  Filled 2019-01-22: qty 10

## 2019-01-22 MED ORDER — SODIUM CHLORIDE 0.9 % IV SOLN: Freq: Once | INTRAVENOUS | Status: DC

## 2019-01-22 MED ORDER — FENTANYL CITRATE (PF) 100 MCG/2ML IJ SOLN: 50.0000 ug | INTRAMUSCULAR | Status: DC | PRN

## 2019-01-22 MED ORDER — ETOMIDATE 2 MG/ML IV SOLN
30.0000 mg | Freq: Once | INTRAVENOUS | Status: AC
  Administered 2019-01-22: 30 mg via INTRAVENOUS

## 2019-01-24 NOTE — Discharge Summary (Signed)
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   #Patient hadurinary retentionyesterday and had to be replaced.  -Continue Flomax.  #Hyponatremia -Likely secondary to acute kidney injury. Expected to resolve with improving renal function  1. Acute kidney injury -placed Foley catheter on admission as he was retaining urine - Renal ultrasound shows mild increased cortical echogenicity bilaterally. No hydronephrosis.  2. 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  Probable metastatic pancreatic cancer: CT scan results from January 16, 2019 suggestive of widespread malignancy with pancreatic primary.  Patient's CA 19-9 is also significantly elevated at 5656.  Given patient's declining performance status, treatment would likely be difficult.  Patient had consultation with his primary oncologist Dr. Mike Gip on Friday, January 19, 2019 at which time he and his wife were amenable to palliative  care and hospice.  Patient is agreeable that upon discharge he wishes to pursue comfort care.  No biopsy is planned at this point.  3. BPH. Continue Flomax and finasteride.   4. Relative hypotension - held All BP meds  5. Type 2 diabetes mellitus  6. History of stroke  DISCHARGE CONDITIONS:  poor CONSULTS OBTAINED:   DRUG ALLERGIES:   Allergies  Allergen Reactions   Donepezil Diarrhea   DISCHARGE MEDICATIONS:   Allergies as of 01/21/2019      Reactions   Donepezil Diarrhea      Medication List    STOP taking these medications   atorvastatin 80 MG tablet Commonly known as: LIPITOR   insulin NPH-regular Human (70-30) 100 UNIT/ML injection     TAKE these medications   amLODipine 10 MG tablet Commonly known as: NORVASC Take 1 tablet (10 mg total) by mouth daily.   clopidogrel 75 MG tablet Commonly known as: PLAVIX Take 1 tablet (75 mg total) by mouth daily.   enalapril 20 MG tablet Commonly known as: VASOTEC Take 1 tablet (20 mg total) by mouth 2 (two) times daily.   finasteride 5 MG tablet Commonly known as: PROSCAR Take 1 tablet (5 mg total) by mouth daily.   hydrochlorothiazide 25 MG tablet Commonly known as: HYDRODIURIL Take 1 tablet (25 mg total) by mouth daily.   memantine 10 MG tablet Commonly known as: NAMENDA Take 10 mg by mouth 2 (two) times daily.   metoprolol tartrate 50 MG tablet Commonly known as: LOPRESSOR Take 1 tablet (50 mg total) by mouth 2 (two) times daily.   ondansetron 4 MG disintegrating tablet Commonly known  as: ZOFRAN-ODT Take 4 mg by mouth every 8 (eight) hours as needed.   tamsulosin 0.4 MG Caps capsule Commonly known as: FLOMAX Take 0.4 mg by mouth daily.   vitamin C 500 MG tablet Commonly known as: ASCORBIC ACID Take 500 mg by mouth daily. AM        DISCHARGE INSTRUCTIONS:   DIET:  Regular diet DISCHARGE CONDITION:  Serious ACTIVITY:  Activity as tolerated OXYGEN:  Home Oxygen: No.  Oxygen  Delivery: room air DISCHARGE LOCATION:  Home with Hospice   If you experience worsening of your admission symptoms, develop shortness of breath, life threatening emergency, suicidal or homicidal thoughts you must seek medical attention immediately by calling 911 or calling your MD immediately  if symptoms less severe.  You Must read complete instructions/literature along with all the possible adverse reactions/side effects for all the Medicines you take and that have been prescribed to you. Take any new Medicines after you have completely understood and accpet all the possible adverse reactions/side effects.   Please note  You were cared for by a hospitalist during your hospital stay. If you have any questions about your discharge medications or the care you received while you were in the hospital after you are discharged, you can call the unit and asked to speak with the hospitalist on call if the hospitalist that took care of you is not available. Once you are discharged, your primary care physician will handle any further medical issues. Please note that NO REFILLS for any discharge medications will be authorized once you are discharged, as it is imperative that you return to your primary care physician (or establish a relationship with a primary care physician if you do not have one) for your aftercare needs so that they can reassess your need for medications and monitor your lab values.    On the day of Discharge:  VITAL SIGNS:  Blood pressure 115/68, pulse 97, temperature 98.7 F (37.1 C), temperature source Oral, resp. rate 17, SpO2 99 %. PHYSICAL EXAMINATION:  GENERAL:  78 y.o.-year-old patient lying in the bed with no acute distress.  EYES: Pupils equal, round, reactive to light and accommodation. No scleral icterus. Extraocular muscles intact.  HEENT: Head atraumatic, normocephalic. Oropharynx and nasopharynx clear.  NECK:  Supple, no jugular venous distention. No thyroid enlargement,  no tenderness.  LUNGS: Normal breath sounds bilaterally, no wheezing, rales,rhonchi or crepitation. No use of accessory muscles of respiration.  CARDIOVASCULAR: S1, S2 normal. No murmurs, rubs, or gallops.  ABDOMEN: Soft, non-tender, non-distended. Bowel sounds present. No organomegaly or mass.  EXTREMITIES: No pedal edema, cyanosis, or clubbing.  NEUROLOGIC: Cranial nerves II through XII are intact. Muscle strength 5/5 in all extremities. Sensation intact. Gait not checked.  PSYCHIATRIC: The patient is alert and oriented x 3.  SKIN: No obvious rash, lesion, or ulcer.  DATA REVIEW:   CBC Recent Labs  Lab 2019/01/30 1845  WBC 20.3*  HGB 8.6*  HCT 26.1*  PLT 338    Chemistries  Recent Labs  Lab 2019/01/30 1845  NA 126*  K 5.9*  CL 90*  CO2 18*  GLUCOSE 311*  BUN 117*  CREATININE 9.45*  CALCIUM 8.2*  AST 145*  ALT 200*  ALKPHOS 1,578*  BILITOT 10.1*     Follow-up Information    Ezequiel Kayser, MD. Schedule an appointment as soon as possible for a visit in 1 week(s).   Specialty: Internal Medicine Contact information: [address] [place] [address] [phone]  Management plans discussed with the patient, family and they are in agreement.  CODE STATUS: DNR, COMFORT CARE - Patient likely to pass soon. Very poor prognosis  TOTAL TIME TAKING CARE OF THIS PATIENT: 45 minutes.    Max Sane M.D on 01/24/2019 at 8:23 AM  Between 7am to 6pm - Pager - [phone]  After 6pm go to [email or web address] - password [email or web address]  [facility]   CC: Primary care physician; Ezequiel Kayser, MD   Note: This dictation was prepared with Dragon dictation along with smaller phrase technology. Any transcriptional errors that result from this process are unintentional.

## 2019-02-20 NOTE — ED Notes (Signed)
Pt's family at bedside (wife and 2 daughters).

## 2019-02-20 NOTE — ED Notes (Signed)
Dentures removed

## 2019-02-20 NOTE — Telephone Encounter (Signed)
Spoke with Mr Darryl Carter wife Darryl Carter )  which she reports the patient has not used the bathroom ( tintkle) after leaving the hospial yesterday.It has been last night and all day today which it is now at 4:25 pm today. I have instructed them to go back to the ED for further evaluation. I have informed his wife that there is nothing we can do in office at this time of day, Mr Darryl Carter was refusing to go back to the ED but I informed Ms Darryl Carter again that the patient need to be seen due to incontinent of urine. So that someone at the ED can take a look at the patient and get him some released. Ms Darryl Carter was agreeable and understanding to get Mr Darryl Carter back to the ED today.

## 2019-02-20 NOTE — ED Triage Notes (Signed)
Pt brought in via ems from home with unresponsiveness and gurgling.  Hx pancreatic cancer.  Discharged from [facility] yesterday  md at bedside.

## 2019-02-20 NOTE — ED Provider Notes (Addendum)
[facility] Emergency Department Provider Note   ____________________________________________   First MD Initiated Contact with Patient February 21, 2019 1850     (approximate)  I have reviewed the triage vital signs and the nursing notes.   HISTORY  Chief Complaint Altered Mental Status   HPI Darryl Carter is a 78 y.o. male with past medical history of hypertension, diabetes, and recently diagnosed metastatic pancreatic cancer presents to the ED for altered mental status.  History is limited secondary to patient's altered mental status.  Per EMS family noted that patient was having difficulty urinating throughout the day today and when he spoke with his oncologist office, they recommend he be evaluated in the ED.  EMS was called and was transporting the patient when he had an acute change in his mental status.  He became minimally responsive with gasping respirations, noted to have dilated pupil on left with constricted pupil on right.  He also vomited some dark red material and became diaphoretic.  He is unable to provide any additional history.  Patient reported to be full code per EMS.        Past Medical History:  Diagnosis Date  . Diabetes mellitus without complication (HCC)    INSULIN  . Dysrhythmia   . Heart murmur   . Hypercholesterolemia   . Hypertension    CONTROLLED ON MEDS  . Renal insufficiency   . Stroke [facility])    CVA '05  . Wears dentures    UPPER AND LOWER    Patient Active Problem List   Diagnosis Date Noted  . Acute renal failure ([place])   . 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 Pancreatic mass 01/19/2019  . 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 Acute kidney injury ([place]) 01/19/2019  . Dehydration 01/19/2019  . Weakness 01/19/2019  . Goals of care, counseling/discussion 01/19/2019  . Abnormal foot pulse 03/03/2018  . Acute low back pain 03/03/2018  . At risk for  falling 03/03/2018  . 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 Cerebral vascular accident ([place]) 03/03/2018  . Fall in home 03/03/2018  . Hypomagnesemia 03/03/2018  . Need for vaccination 03/03/2018  . PND (post-nasal drip) 03/03/2018  . B12 deficiency 02/06/2018  . High risk medication use 02/05/2018  . Mild chronic anemia 02/05/2018  . Family history of colon cancer 04/19/2017  . Mild cognitive impairment with memory loss 02/14/2017  . Hypercholesteremia 10/10/2014  . Type 2 diabetes mellitus treated with insulin ([place]) 10/10/2014  . Impaired renal function 10/10/2014  . Cerebrovascular accident, old 10/10/2014  . Hypertension 10/10/2014  . HLD (hyperlipidemia) 10/10/2014  . Long-term insulin use ([place]) 12/20/2013  . Microalbuminuria 12/20/2013  . 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 CATARACT EXTRACTION Left 2010  . CATARACT EXTRACTION W/PHACO Right 07/14/2016   Procedure: CATARACT EXTRACTION PHACO AND INTRAOCULAR LENS PLACEMENT ([place]) Diabetic Right;  Surgeon: Leandrew Koyanagi, MD;  Location: [facility];  Service: Ophthalmology;  Laterality: Right;  DIABETES-INSULIN DEPENDENT  . COLONOSCOPY    . COLONOSCOPY WITH PROPOFOL N/A 03/30/2017   Procedure: COLONOSCOPY WITH PROPOFOL;  Surgeon: Toledo, [name], MD;  Location: [facility] ENDOSCOPY;  Service: Gastroenterology;  Laterality: N/A;    Prior to Admission medications   Medication Sig Start Date End Date Taking? Authorizing Provider  amLODipine (NORVASC) 10 MG tablet Take 1 tablet (10 mg total) by mouth daily.  04/18/15  Yes Roselee Nova, MD  clopidogrel (PLAVIX) 75 MG tablet Take 1 tablet (75 mg total) by mouth daily. 10/10/14  Yes Rochel Brome A, MD  enalapril (VASOTEC) 20 MG tablet Take 1 tablet (20 mg total) by mouth 2 (two) times daily. 04/18/15  Yes Roselee Nova, MD  finasteride (PROSCAR) 5 MG tablet Take 1  tablet (5 mg total) by mouth daily. 03/03/18  Yes Hollice Espy, MD  hydrochlorothiazide (HYDRODIURIL) 25 MG tablet Take 1 tablet (25 mg total) by mouth daily. 04/18/15  Yes Roselee Nova, MD  memantine (NAMENDA) 10 MG tablet Take 10 mg by mouth 2 (two) times daily. 11/03/17  Yes [provider]  metoprolol (LOPRESSOR) 50 MG tablet Take 1 tablet (50 mg total) by mouth 2 (two) times daily. 04/18/15  Yes Rochel Brome A, MD  ondansetron (ZOFRAN-ODT) 4 MG disintegrating tablet Take 4 mg by mouth every 8 (eight) hours as needed.  01/08/19  Yes [provider]  tamsulosin (FLOMAX) 0.4 MG CAPS capsule Take 0.4 mg by mouth daily.  08/06/18  Yes [provider]  vitamin C (ASCORBIC ACID) 500 MG tablet Take 500 mg by mouth daily. AM   Yes [provider]    Allergies Donepezil  Family History  Problem Relation Age of Onset  . Hypertension Mother   . Diabetes Mother   . Hypertension Father   . Diabetes Father   . Hypertension Sister   . Diabetes Sister   . Colon cancer Sister   . Hypertension Brother   . Diabetes Brother     Social History Social History   Tobacco Use  . Smoking status: Former Smoker    Packs/day: 2.00    Years: 34.00    Pack years: 68.00    Types: Cigarettes    Quit date: 04/24/2001    Years since quitting: 17.7  . Smokeless tobacco: Never Used  Substance Use Topics  . Alcohol use: No    Alcohol/week: 0.0 standard drinks    Comment: Quit in 2003  . Drug use: No    Review of Systems Unable to obtain secondary to altered mental status  ____________________________________________   PHYSICAL EXAM:  VITAL SIGNS: ED Triage Vitals [02/06/19 1842]  Enc Vitals Group     BP 114/61     Pulse Rate 90     Resp      Temp 97.6 F (36.4 C)     Temp Source Axillary     SpO2 (!) 80 %     Weight      Height      Head Circumference      Peak Flow      Pain Score      Pain Loc      Pain Edu?      Excl. in [place]?      Constitutional: Minimally responsive Eyes: Left pupil fixed and dilated, right pupil constricted. Head: Atraumatic. Nose: No congestion/rhinnorhea. Mouth/Throat: Mucous membranes are moist. Neck: Normal ROM Cardiovascular: Normal rate, regular rhythm. Grossly normal heart sounds. Respiratory: Gasping respirations, bilateral rales. Gastrointestinal: Soft and nontender. No distention. Genitourinary: deferred Musculoskeletal: No lower extremity tenderness nor edema. Neurologic: Groaning, no coherent verbal response.  Withdraws from pain on the left side with no response to pain on the right. Skin: Diaphoretic.   ____________________________________________   LABS (all labs ordered are listed, but only abnormal results are displayed)  Labs Reviewed  CBC - Abnormal; Notable for the following  components:      Result Value   WBC 20.3 (*)    RBC 3.55 (*)    Hemoglobin 8.6 (*)    HCT 26.1 (*)    MCV 73.5 (*)    MCH 24.2 (*)    RDW 20.2 (*)    All other components within normal limits  DIFFERENTIAL - Abnormal; Notable for the following components:   Neutro Abs 17.6 (*)    Monocytes Absolute 1.1 (*)    Abs Immature Granulocytes 0.17 (*)    All other components within normal limits  COMPREHENSIVE METABOLIC PANEL - Abnormal; Notable for the following components:   Sodium 126 (*)    Potassium 5.9 (*)    Chloride 90 (*)    CO2 18 (*)    Glucose, Bld 311 (*)    BUN 117 (*)    Creatinine, Ser 9.45 (*)    Calcium 8.2 (*)    Total Protein 5.8 (*)    Albumin 1.9 (*)    AST 145 (*)    ALT 200 (*)    Alkaline Phosphatase 1,578 (*)    Total Bilirubin 10.1 (*)    GFR calc non Af Amer 5 (*)    GFR calc Af Amer 6 (*)    Anion gap 18 (*)    All other components within normal limits  LACTIC ACID, PLASMA   ____________________________________________  EKG  ED ECG REPORT I, Blake Divine, the attending physician, personally viewed and interpreted this ECG.   Date: 2019-02-15  EKG  Time: 18:39  Rate: 77  Rhythm: normal sinus rhythm  Axis: Normal  Intervals:none  ST&T Change: T wave flattening inferiorly   PROCEDURES  Procedure(s) performed (including Critical Care):  .Critical Care Performed by: Blake Divine, MD Authorized by: Blake Divine, MD   Critical care provider statement:    Critical care time (minutes):  45   Critical care time was exclusive of:  Separately billable procedures and treating other patients and teaching time   Critical care was necessary to treat or prevent imminent or life-threatening deterioration of the following conditions:  Respiratory failure   Critical care was time spent personally by me on the following activities:  Discussions with consultants, evaluation of patient's response to treatment, examination of patient, ordering and performing treatments and interventions, ordering and review of laboratory studies, ordering and review of radiographic studies, pulse oximetry, re-evaluation of patient's condition, obtaining history from patient or surrogate and review of old charts   I assumed direction of critical care for this patient from another provider in my specialty: no       ____________________________________________   INITIAL IMPRESSION / ASSESSMENT AND PLAN / ED COURSE       78 year old male with history of recently diagnosed pancreatic cancer presents to the ED initially for difficulty urinating, subsequently developed acute change in mental status.  Upon arrival, he has left pupil dilation compared to the right as well as minimal response to pain on the right side.  He was not protecting his airway with GCS less than 8 and subsequently intubated for airway protection.  He did have some emesis of dark material appearing to be mixed with blood during course of intubation.  Code stroke was called given patient's neurologic deficits, head CT negative for acute process.  Upon review of patient's chart, noted that he was  discharged yesterday with plans to receive hospice care at home.  Discussed with patient's wife, who confirms that he is DNR/DNI despite report of  full code by EMS.  At this time, wife wishes for patient to be extubated and to transition him to comfort care.  Patient was extubated to nasal cannula and allowed to visit with family.  He became increasingly bradycardic and eventually developed asystole.  Time of death was called at 20:51.  Case discussed with medical examiner, who declines case.  Patient's oncologist to be contacted for death certificate.      ____________________________________________   FINAL CLINICAL IMPRESSION(S) / ED DIAGNOSES  Final diagnoses:  Altered mental status, unspecified altered mental status type  Renal failure, unspecified chronicity     ED Discharge Orders    None       Note:  This document was prepared using Dragon voice recognition software and may include unintentional dictation errors.   Blake Divine, MD 2019/02/05 2103    Blake Divine, MD 02-05-19 2131

## 2019-02-20 NOTE — ED Notes (Signed)
Call placed to [facility] and spoke with Bethena Roys. Pt is suitable for tissue donation only if appropriate. Reference number: [id number].

## 2019-02-20 NOTE — Telephone Encounter (Signed)
-----   Message from Lequita Asal, MD sent at February 09, 2019  4:22 PM EST ----- Regarding: RE: Hospice Contact: [phone]  I am in agreement for Hospice.  M  ----- Message ----- From: Secundino Ginger Sent: February 09, 2019   1:24 PM EST To: Arlan Organ, RN, # Subject: Hospice                                        Debra, at [facility] called and said this pt was discharged from hospital and wants hospice care at home. Hilda Blades needs verbal confirmation if you are in agreement.

## 2019-02-20 NOTE — ED Notes (Addendum)
Pt brought in via ems from home unresponsive and gurgling.  Hx pancreatic cancer.  Ems report no urine output since yesterday.  abd distended and pt pale, diaphoretic on arrival with 2 iv's in place.  md at bedside.  Left pupil dilated and right pupil pinpoint on arrival  Code stroke called by dr Charna Archer.  Pt intubated at 1844 by dr Charna Archer with RT at bedside to place pt on vent.  8.0 ett at 25 at the lip.  meds given.  LR infusing in each arm. NSR on monitor.  Etomidate and Roc given by Christean Grief.

## 2019-02-20 NOTE — ED Notes (Signed)
Report off to dee rn

## 2019-02-20 NOTE — Telephone Encounter (Signed)
Spoke with Hilda Blades, RN to give verbal orders for Hospice. I was then transferred to triage nurse, Donella Stade, [place]. Verbal orders for hospice provided. Nurse verbalizes understanding and denies any further questions or concerns.

## 2019-02-20 NOTE — ED Notes (Signed)
Pt extubated by RT with no issue. Family back at bedside.

## 2019-02-20 NOTE — ED Notes (Signed)
Expired at 2051. EDP at bedside.

## 2019-02-20 NOTE — ED Notes (Signed)
Pt to ct scan with dee rn and RT.

## 2019-02-20 NOTE — Telephone Encounter (Signed)
-----   Message from Secundino Ginger sent at 02/06/19  4:01 PM EST ----- Regarding: INCONTINENT Contact: [phone] Langley Gauss (wife) called and said Cardier has not been able to urinate since he was discharged from hospital yesterday. She is getting very concerned and would like for you to call her.

## 2019-02-20 DEATH — deceased

## 2019-06-08 ENCOUNTER — Ambulatory Visit: Payer: Medicare Other | Admitting: Urology

## 2021-04-11 IMAGING — US US RENAL
1 series · 14 of 25 positions shown · non-contrast
Comparison: CT of the abdomen on 01/16/2019

CLINICAL DATA: Acute renal failure.

EXAM:
RENAL / URINARY TRACT ULTRASOUND COMPLETE

[Series 1: us renal · 0.22mm/px · 14 of 42 slices shown]
[im 1/42]
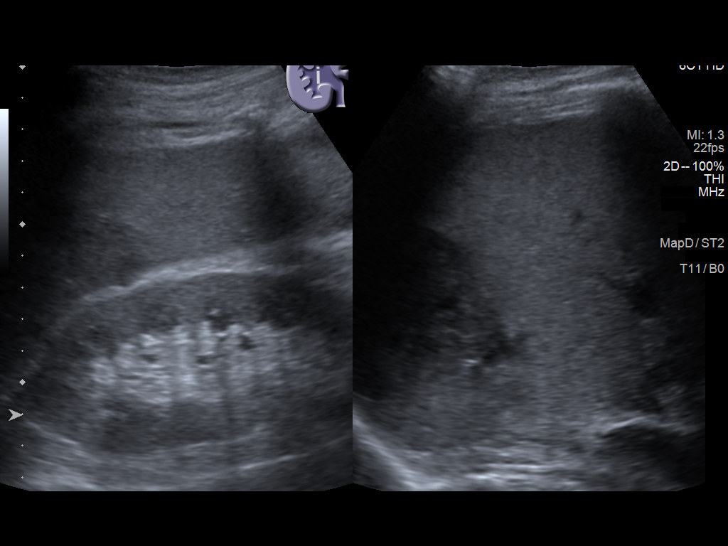
[im 4/42]
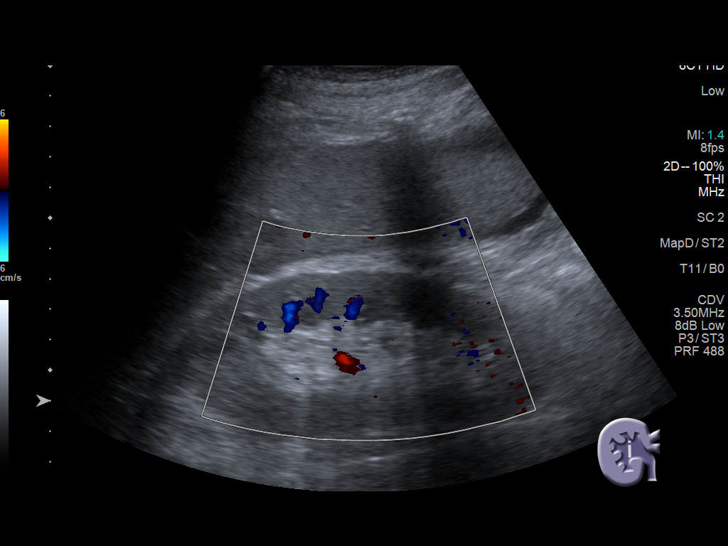
[im 7/42]
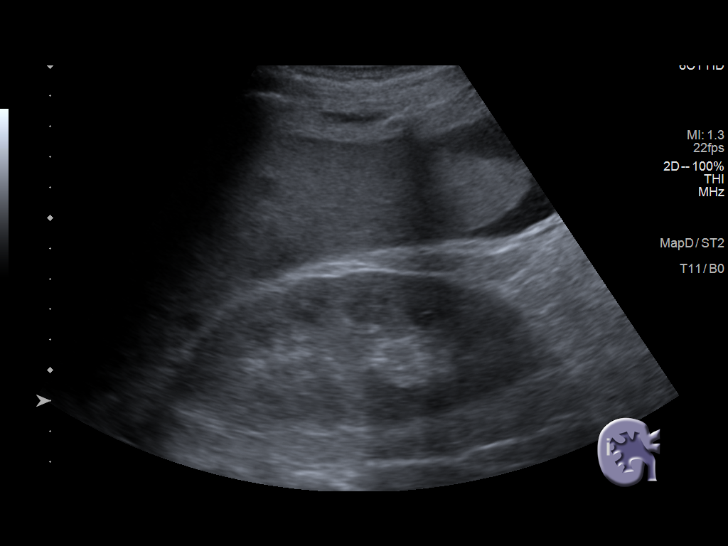
[im 11/42]
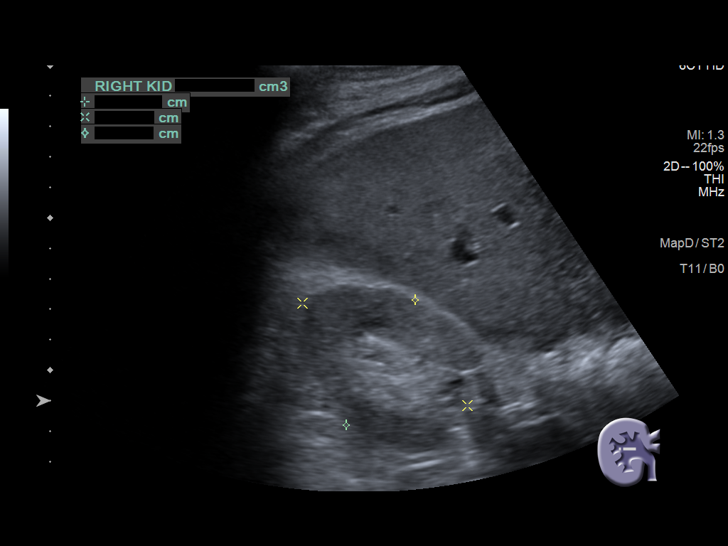
[im 14/42]
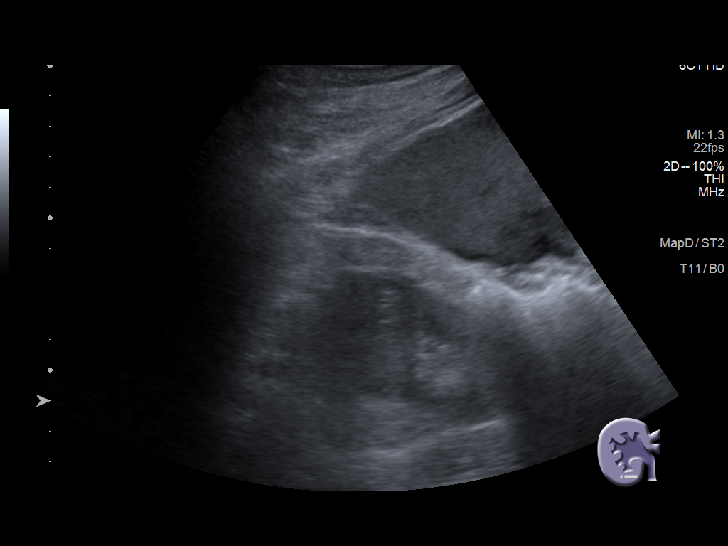
[im 16/42]
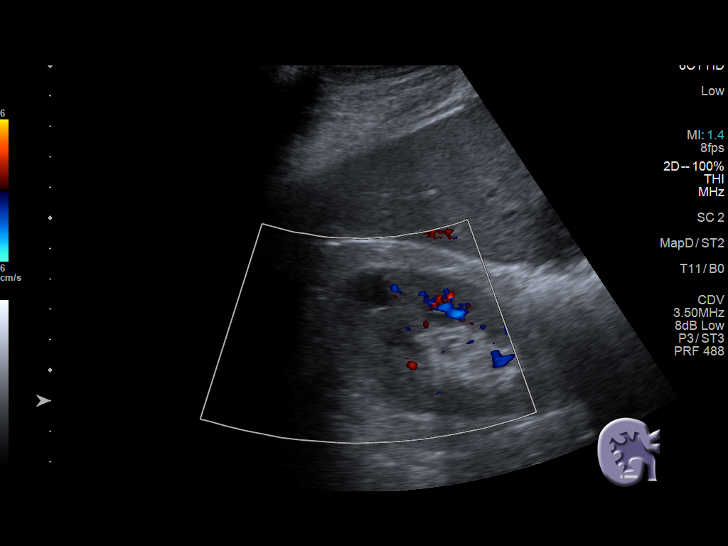
[im 19/42]
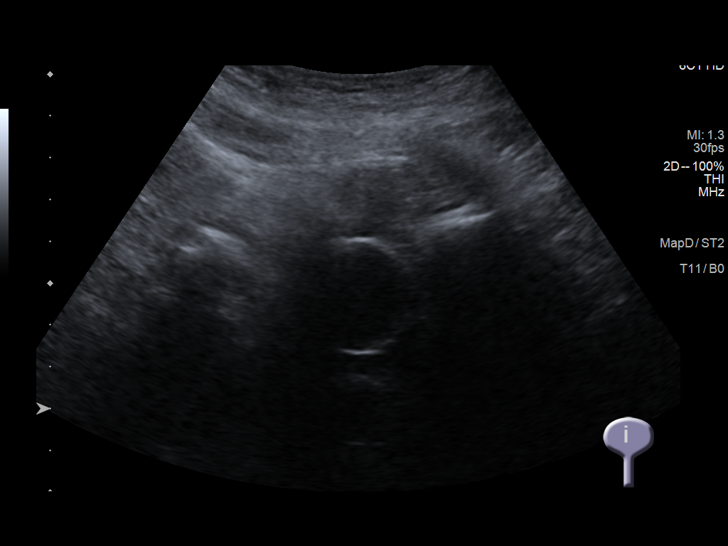
[im 23/42]
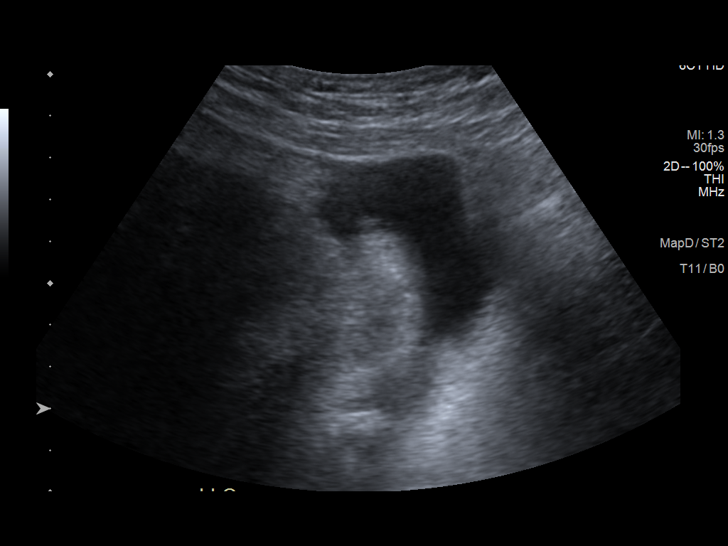
[im 26/42]
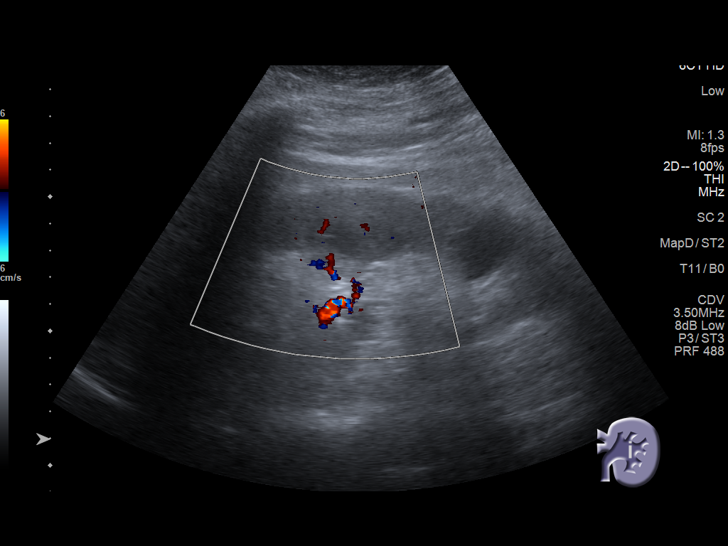
[im 28/42]
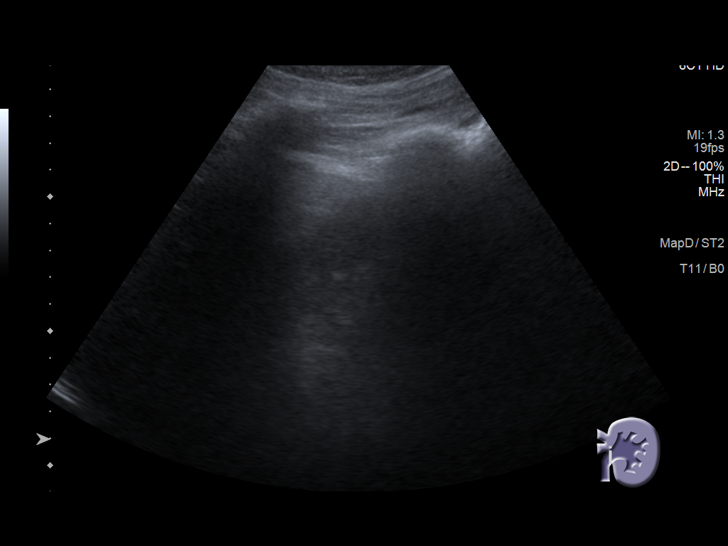
[im 31/42]
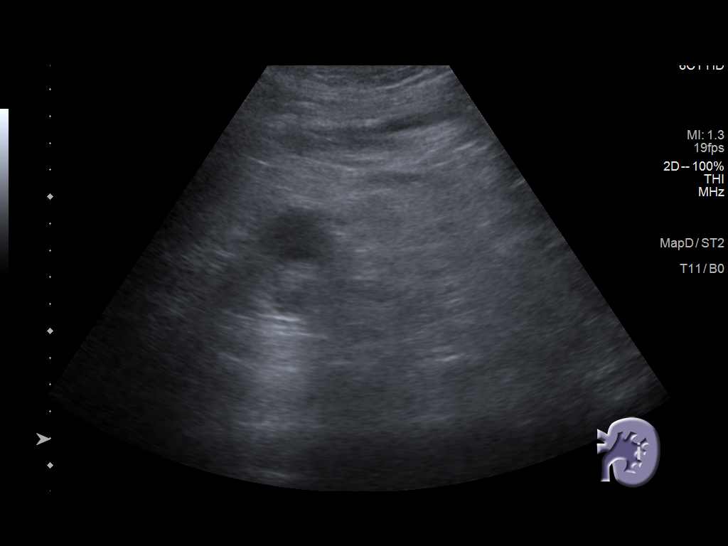
[im 35/42]
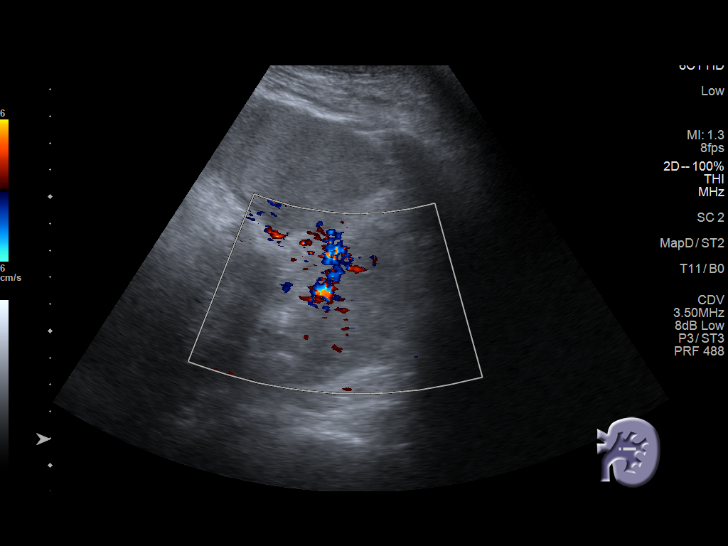
[im 38/42]
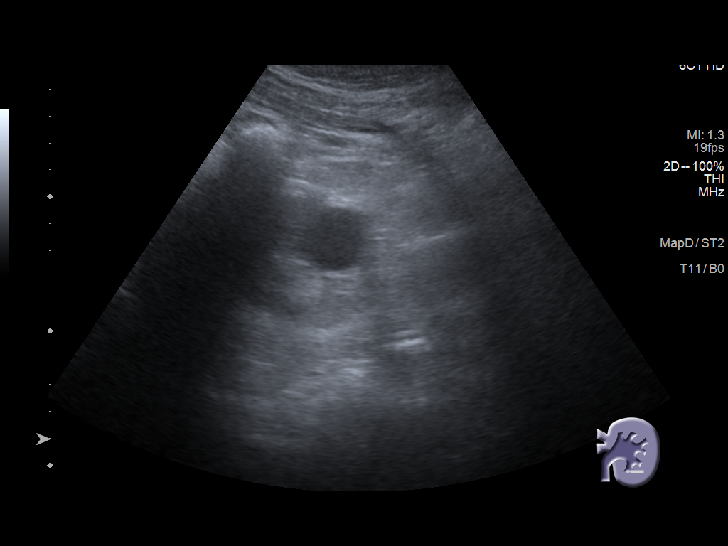
[im 42/42]
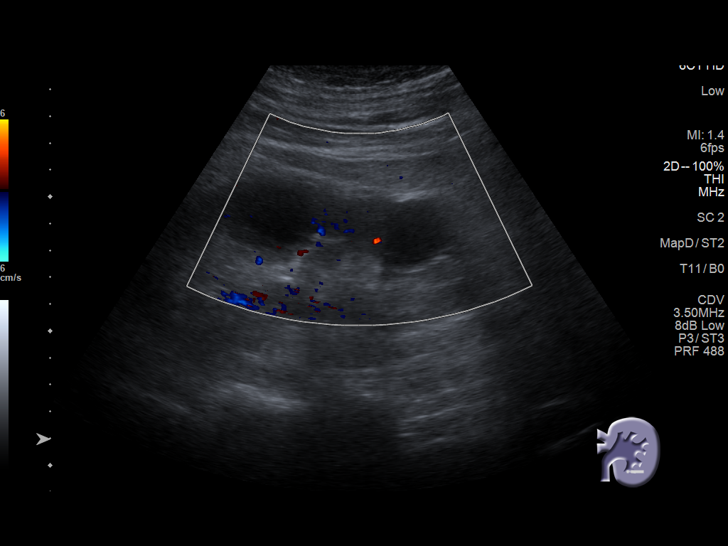

[14 of 25 positions shown; findings below may reference images not displayed]

FINDINGS: Right Kidney:

Renal measurements: 13.2 x 6.4 x 4.7 cm = volume: 206 mL. No
hydronephrosis. Minimally increased cortical echogenicity may be
consistent with a component of chronic kidney disease. No solid
masses. Simple cyst in the interpolar region measures 1.5 cm.

Left Kidney:

Renal measurements: 14.1 x 5.4 x 6.0 cm = volume: 242 mL. Mildly
increased cortical echogenicity. Simple cyst present with the
largest measuring 3 cm. No hydronephrosis. No solid masses.

Bladder:

Decompressed by Foley catheter.
IMPRESSION: Mildly increased cortical echogenicity of both kidneys may be
consistent with a component of chronic kidney disease. No
hydronephrosis bilaterally.

## 2021-04-13 IMAGING — CT CT HEAD CODE STROKE
3 series · 14 of 47 positions shown, 16 images · non-contrast
Comparison: None.

CLINICAL DATA: Code stroke. Acute onset of altered level of
consciousness.

EXAM:
CT HEAD WITHOUT CONTRAST
TECHNIQUE: Contiguous axial images were obtained from the base of the skull
through the vertex without intravenous contrast.

[Series 2: head wo · axial · 0.45mm/px · z∈[+188,+318]mm · 8 of 32 slices shown, 10 images]
[im 3/32  brain]
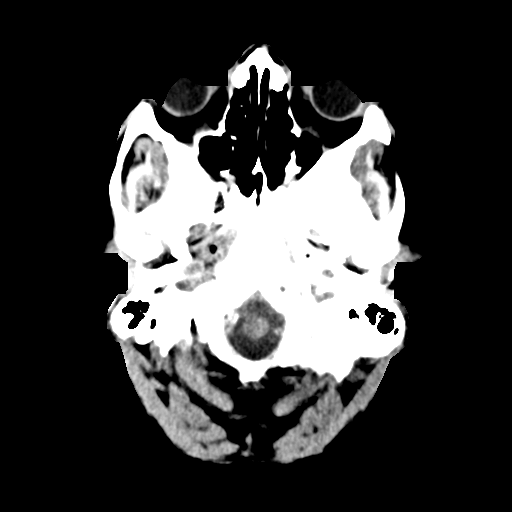
[im 3/32  bone]
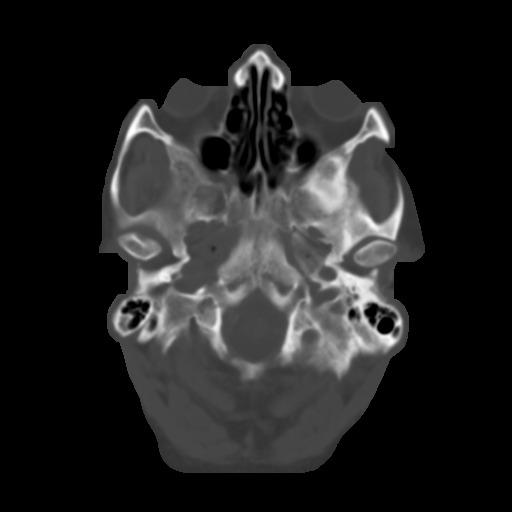
[im 7/32  brain]
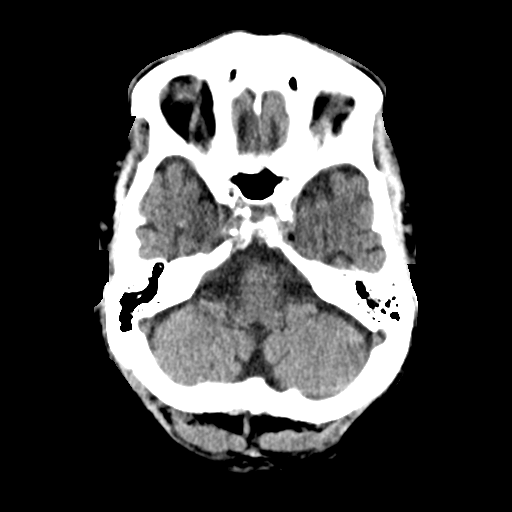
[im 10/32  brain]
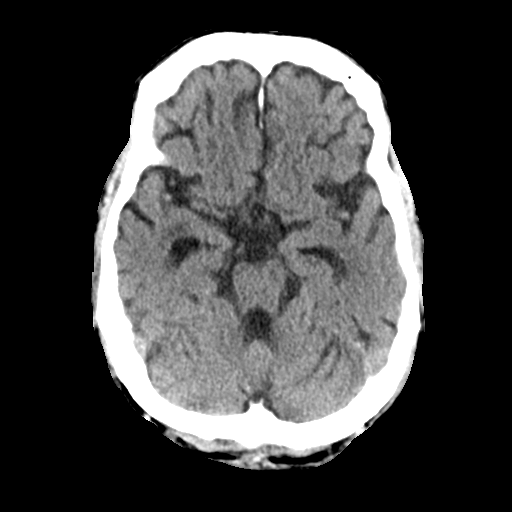
[im 14/32  brain]
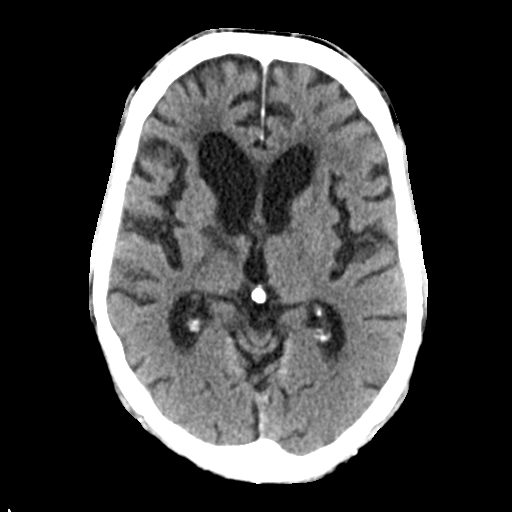
[im 18/32  brain]
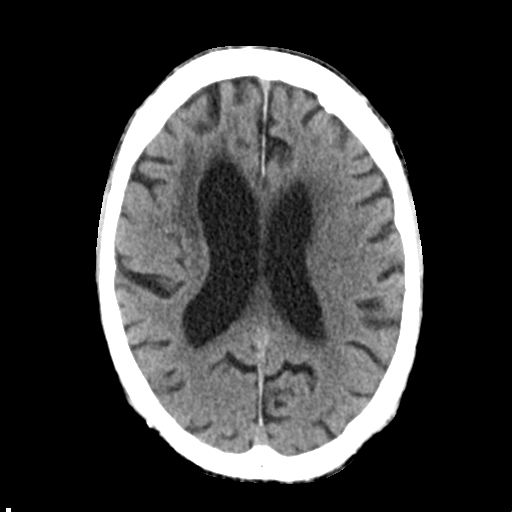
[im 18/32  bone]
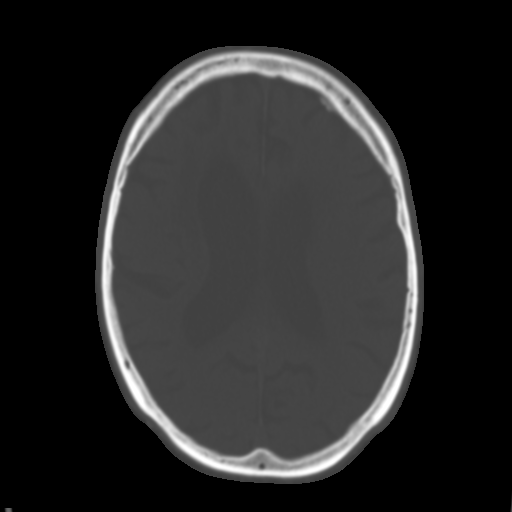
[im 22/32  brain]
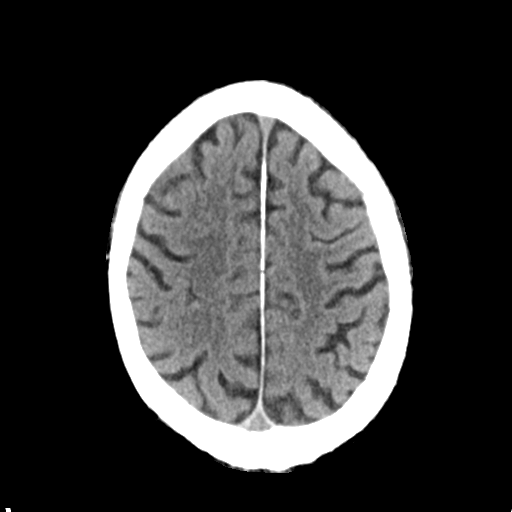
[im 25/32  brain]
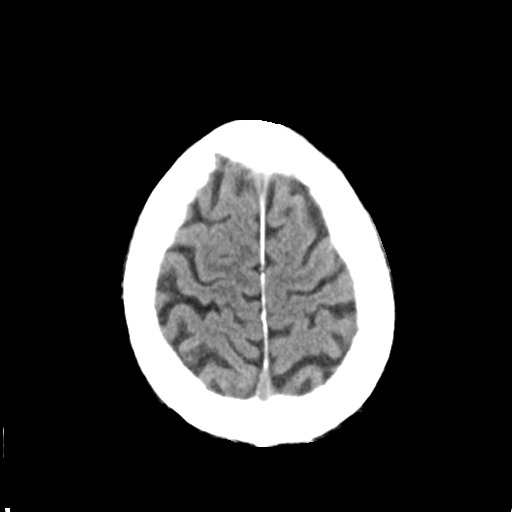
[im 29/32  brain]
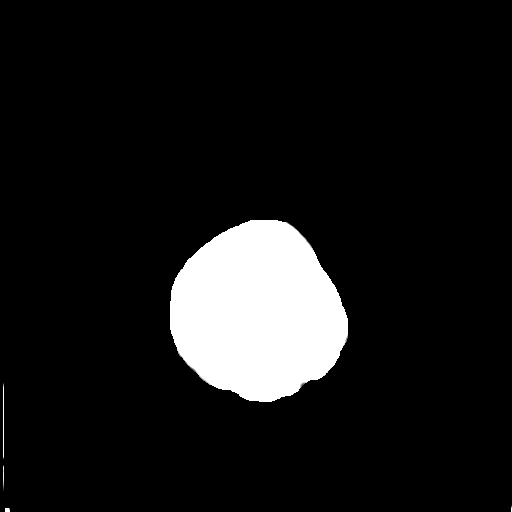

[Series 4: coronal soft tissue · coronal · 0.32mm/px · 3 of 67 slices shown]
[im 23/67  brain]
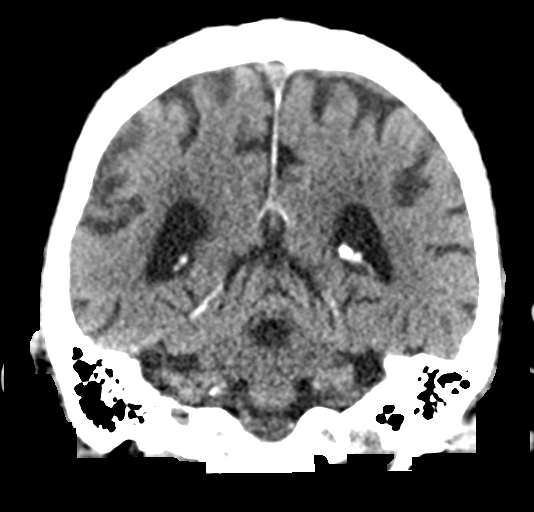
[im 30/67  brain]
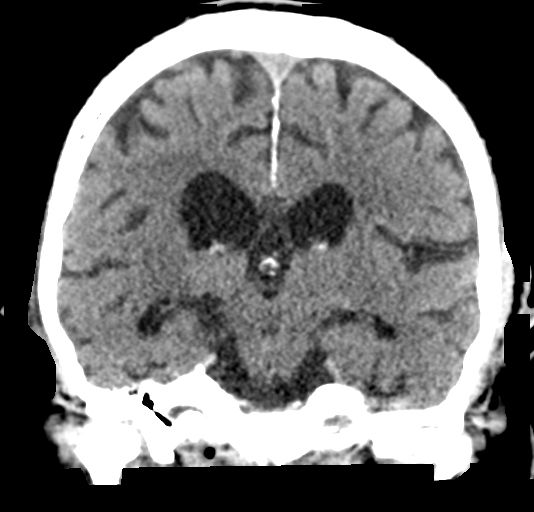
[im 37/67  brain]
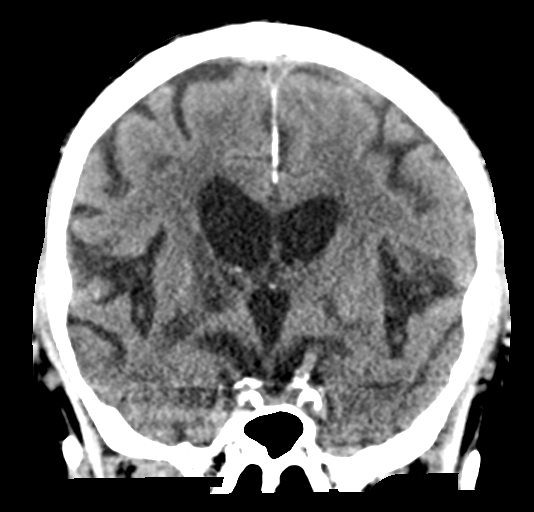

[Series 5: sagittal soft tissue · sagittal · 0.32mm/px · 3 of 53 slices shown]
[im 18/53  brain]
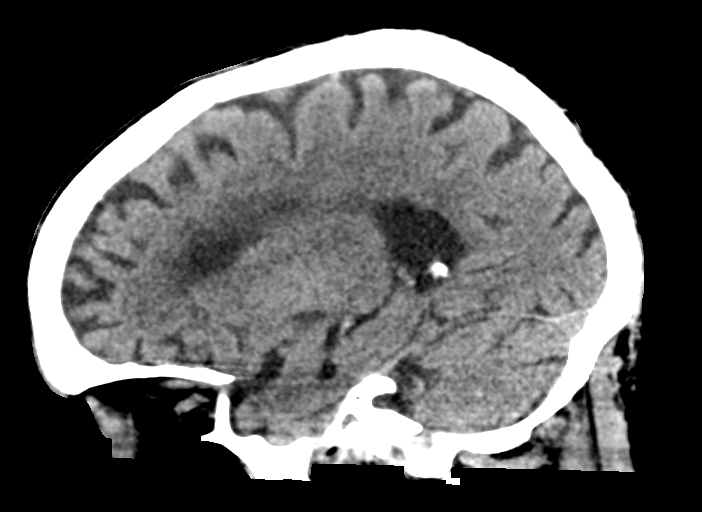
[im 27/53  brain]
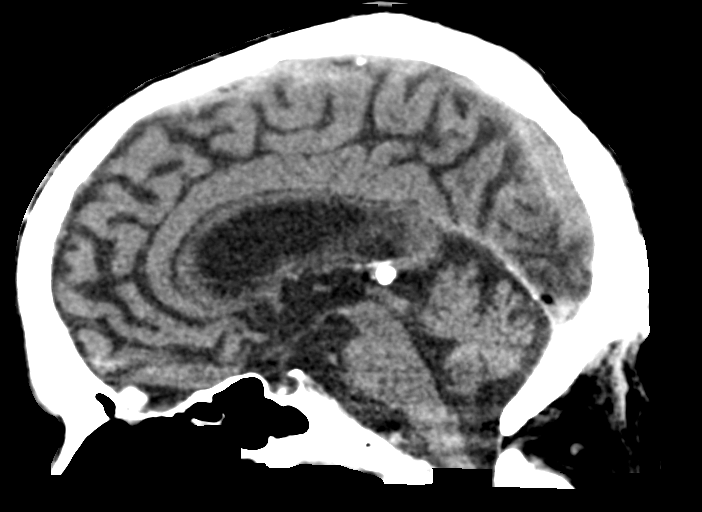
[im 35/53  brain]
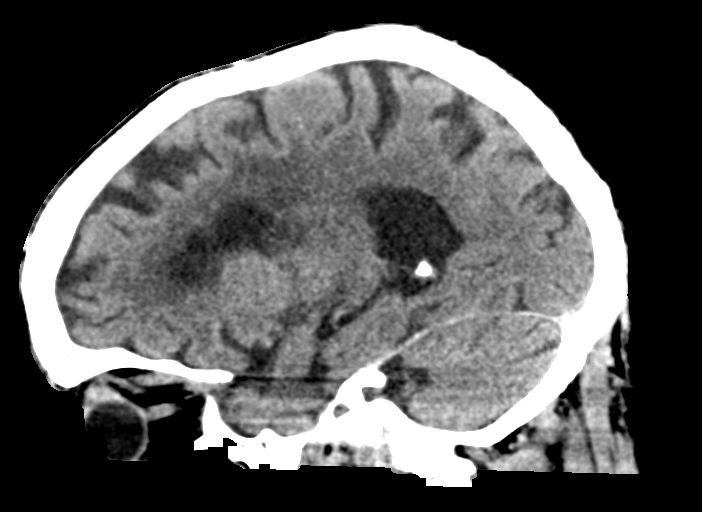

[14 of 47 positions shown; findings below may reference images not displayed]

FINDINGS: Brain: Lacunar infarcts of the right basal ganglia are likely remote
with ex vacuo dilation of the right lateral ventricle. Asymmetric
periventricular white matter disease present on the right as well.
Additional remote lacunar infarcts are present in the left basal
ganglia. Remote ischemic changes are noted in the thalami
bilaterally.

Insular ribbon is normal bilaterally. No acute cortical
abnormalities are present. There is no acute hemorrhage or mass
lesion. The brainstem and cerebellum are within normal limits.

Vascular: Dense atherosclerotic changes are noted within the
cavernous internal carotid arteries bilaterally. Calcifications are
also present in the right vertebral artery at the dural margin.
There is no hyperdense vessel.

Skull: Calvarium is intact. No focal lytic or blastic lesions are
present.

Sinuses/Orbits: The paranasal sinuses and mastoid air cells are
clear. Bilateral lens replacements are noted. Globes and orbits are
otherwise unremarkable.

ASPECTS (Alberta Stroke Program Early CT Score)

- Ganglionic level infarction (caudate, lentiform nuclei, internal
capsule, insula, M1-M3 cortex): [DATE]

- Supraganglionic infarction (M4-M6 cortex): [DATE]

Total score (0-10 with 10 being normal): [DATE]
IMPRESSION: 1. No acute intracranial abnormality.
2. Remote lacunar infarct of the right basal ganglia.
3. Smaller scattered remote lacunar infarcts of the left basal
ganglia.
4. Remote ischemic changes of the thalami bilaterally.
5. Advanced atrophy and white matter disease likely reflects the
sequela of chronic microvascular ischemia
6. ASPECTS is [DATE]

These results were called by telephone at the time of interpretation
on 01/22/2019 at [DATE] to provider BEDOIDZE YURMASHVILI , who verbally
acknowledged these results.

## 2021-04-13 IMAGING — CR DG CHEST 1V PORT
1 series · 1 of 1 positions shown · non-contrast
Comparison: Chest x-ray of 05/19/2013

CLINICAL DATA: Post intubation.

EXAM:
PORTABLE CHEST 1 VIEW

[dg chest port 1 view]
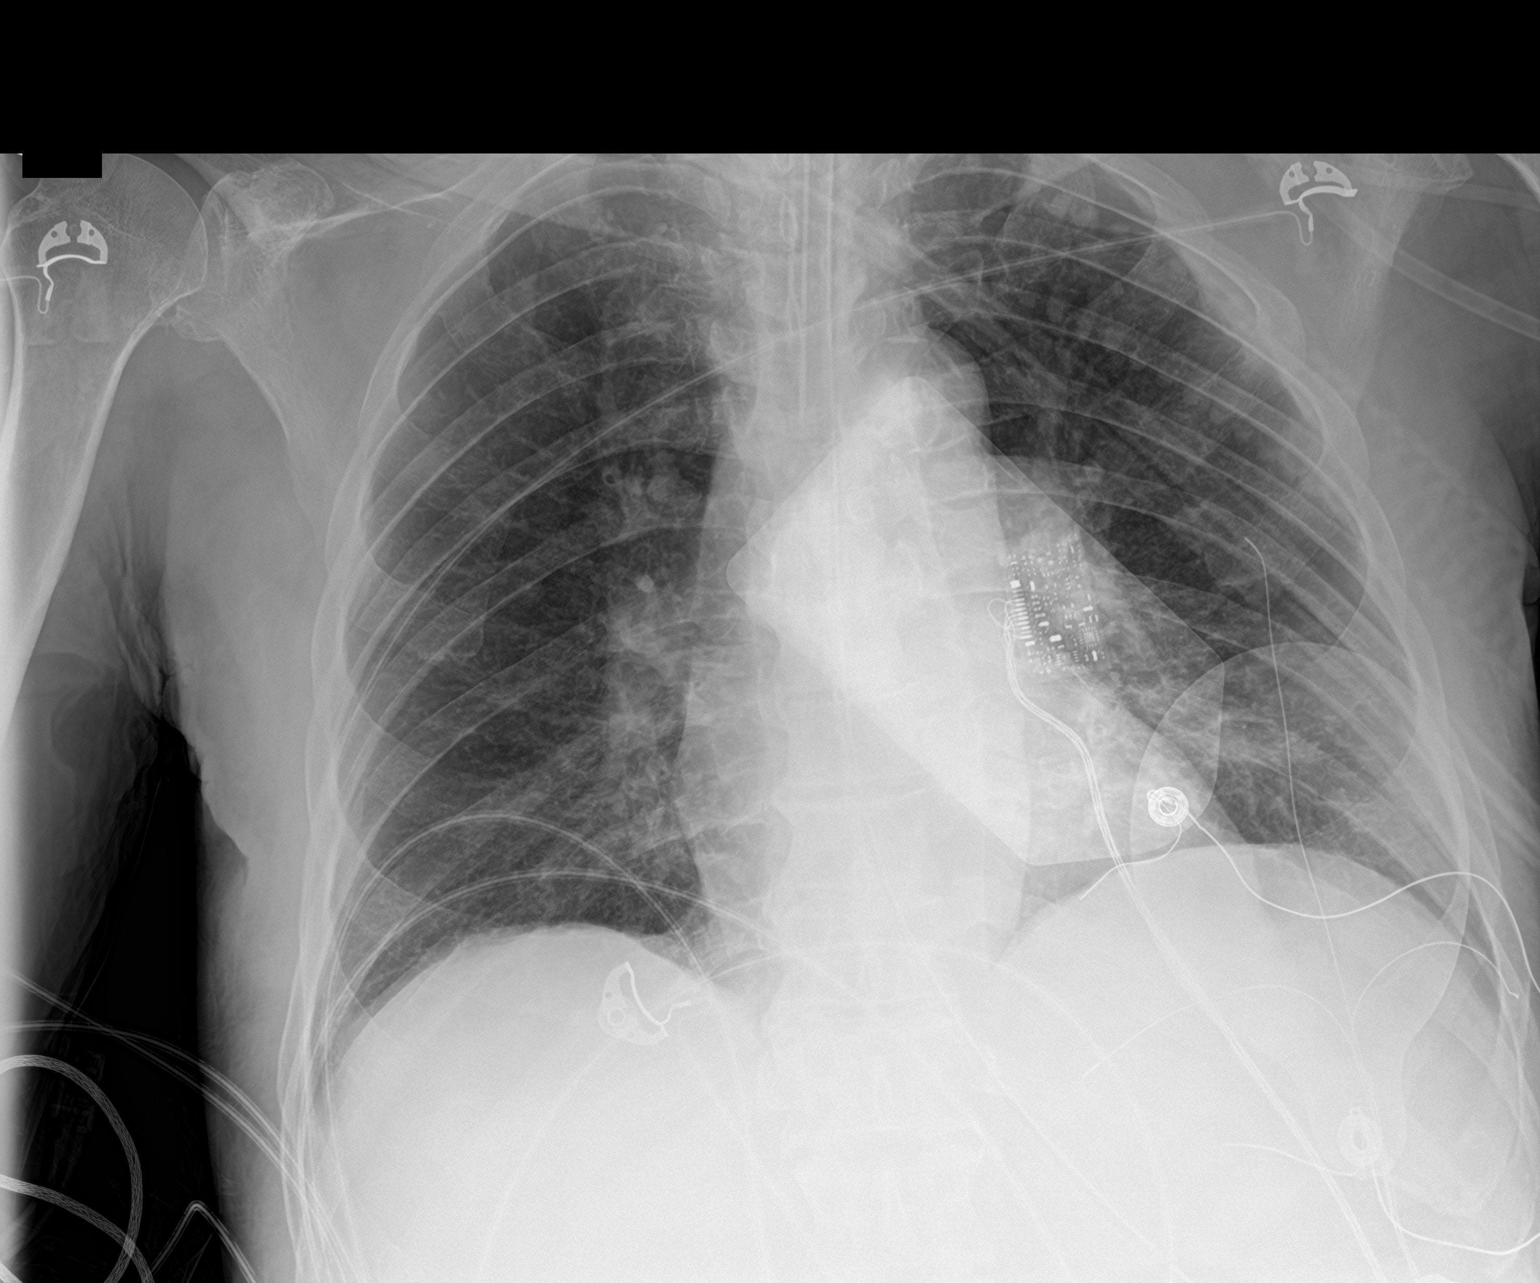

[1 of 1 positions shown; findings below may reference images not displayed]

FINDINGS: Study is limited by overlying pacer defibrillator pads.

Endotracheal tube in place approximately 1.5 cm above the level of
the carina.

Enteric tube in place as well terminates off the field of the
radiograph in the upper abdomen. Signs of calcified atherosclerosis.
Heart size normal.

Basilar atelectasis. No signs of effusion or consolidation.

No acute bone finding.
IMPRESSION: Endotracheal tube is in place approximately 1.5 cm above the level
of the carina. Tube could be retracted approximately 1-1.5 cm for
more optimal placement. Enteric tube terminates off the field of
the radiograph in the upper abdomen.
